# Patient Record
Sex: Female | Born: 1935 | Race: Black or African American | Hispanic: No | Smoking: Never smoker
Health system: Southern US, Community
[De-identification: ages and names within clinical notes are randomized; demographics above are authoritative.]

## PROBLEM LIST (undated history)

## (undated) DIAGNOSIS — G5600 Carpal tunnel syndrome, unspecified upper limb: Secondary | ICD-10-CM

## (undated) DIAGNOSIS — M797 Fibromyalgia: Secondary | ICD-10-CM

## (undated) DIAGNOSIS — F329 Major depressive disorder, single episode, unspecified: Secondary | ICD-10-CM

## (undated) DIAGNOSIS — I10 Essential (primary) hypertension: Secondary | ICD-10-CM

## (undated) DIAGNOSIS — R32 Unspecified urinary incontinence: Secondary | ICD-10-CM

## (undated) DIAGNOSIS — F0390 Unspecified dementia without behavioral disturbance: Secondary | ICD-10-CM

## (undated) DIAGNOSIS — F32A Depression, unspecified: Secondary | ICD-10-CM

## (undated) DIAGNOSIS — E039 Hypothyroidism, unspecified: Secondary | ICD-10-CM

## (undated) DIAGNOSIS — M4800 Spinal stenosis, site unspecified: Secondary | ICD-10-CM

## (undated) HISTORY — DX: Hypothyroidism, unspecified: E03.9

## (undated) HISTORY — DX: Major depressive disorder, single episode, unspecified: F32.9

## (undated) HISTORY — PX: SPINE SURGERY: SHX786

## (undated) HISTORY — DX: Essential (primary) hypertension: I10

## (undated) HISTORY — DX: Unspecified urinary incontinence: R32

## (undated) HISTORY — DX: Depression, unspecified: F32.A

---

## 1963-03-16 HISTORY — PX: BREAST LUMPECTOMY: SHX2

## 1997-07-18 ENCOUNTER — Ambulatory Visit: Admission: RE | Admit: 1997-07-18 | Discharge: 1997-07-18 | Payer: Self-pay | Admitting: Family Medicine

## 1998-10-30 ENCOUNTER — Observation Stay (HOSPITAL_COMMUNITY): Admission: AD | Admit: 1998-10-30 | Discharge: 1998-11-01 | Payer: Self-pay | Admitting: Family Medicine

## 1998-10-30 ENCOUNTER — Encounter: Payer: Self-pay | Admitting: Family Medicine

## 1998-12-04 ENCOUNTER — Encounter: Payer: Self-pay | Admitting: Family Medicine

## 1998-12-04 ENCOUNTER — Ambulatory Visit (HOSPITAL_COMMUNITY): Admission: RE | Admit: 1998-12-04 | Discharge: 1998-12-04 | Payer: Self-pay | Admitting: Family Medicine

## 1999-01-14 ENCOUNTER — Encounter: Payer: Self-pay | Admitting: Cardiology

## 1999-01-14 ENCOUNTER — Ambulatory Visit (HOSPITAL_COMMUNITY): Admission: RE | Admit: 1999-01-14 | Discharge: 1999-01-14 | Payer: Self-pay | Admitting: Cardiology

## 1999-06-24 ENCOUNTER — Encounter: Payer: Self-pay | Admitting: Family Medicine

## 1999-06-24 ENCOUNTER — Ambulatory Visit (HOSPITAL_COMMUNITY): Admission: RE | Admit: 1999-06-24 | Discharge: 1999-06-24 | Payer: Self-pay | Admitting: Family Medicine

## 1999-07-07 ENCOUNTER — Encounter: Payer: Self-pay | Admitting: Neurosurgery

## 1999-07-09 ENCOUNTER — Inpatient Hospital Stay (HOSPITAL_COMMUNITY): Admission: RE | Admit: 1999-07-09 | Discharge: 1999-07-14 | Payer: Self-pay | Admitting: Neurosurgery

## 1999-07-14 ENCOUNTER — Inpatient Hospital Stay (HOSPITAL_COMMUNITY)
Admission: RE | Admit: 1999-07-14 | Discharge: 1999-07-23 | Payer: Self-pay | Admitting: Physical Medicine and Rehabilitation

## 1999-10-22 ENCOUNTER — Encounter: Admission: RE | Admit: 1999-10-22 | Discharge: 2000-01-20 | Payer: Self-pay | Admitting: Family Medicine

## 2000-04-04 ENCOUNTER — Encounter: Admission: RE | Admit: 2000-04-04 | Discharge: 2000-07-03 | Payer: Self-pay | Admitting: Family Medicine

## 2000-04-19 ENCOUNTER — Encounter: Admission: RE | Admit: 2000-04-19 | Discharge: 2000-04-19 | Payer: Self-pay | Admitting: Otolaryngology

## 2000-04-19 ENCOUNTER — Encounter: Payer: Self-pay | Admitting: Otolaryngology

## 2000-10-04 ENCOUNTER — Ambulatory Visit (HOSPITAL_COMMUNITY): Admission: RE | Admit: 2000-10-04 | Discharge: 2000-10-04 | Payer: Self-pay | Admitting: Obstetrics

## 2000-10-04 ENCOUNTER — Encounter: Payer: Self-pay | Admitting: Obstetrics

## 2000-10-05 ENCOUNTER — Ambulatory Visit (HOSPITAL_COMMUNITY): Admission: RE | Admit: 2000-10-05 | Discharge: 2000-10-05 | Payer: Self-pay | Admitting: Obstetrics

## 2000-10-05 ENCOUNTER — Encounter (INDEPENDENT_AMBULATORY_CARE_PROVIDER_SITE_OTHER): Payer: Self-pay

## 2003-07-08 ENCOUNTER — Encounter: Admission: RE | Admit: 2003-07-08 | Discharge: 2003-07-08 | Payer: Self-pay | Admitting: Family Medicine

## 2003-10-31 ENCOUNTER — Ambulatory Visit (HOSPITAL_COMMUNITY): Admission: RE | Admit: 2003-10-31 | Discharge: 2003-10-31 | Payer: Self-pay | Admitting: Obstetrics and Gynecology

## 2004-07-09 ENCOUNTER — Encounter: Admission: RE | Admit: 2004-07-09 | Discharge: 2004-07-09 | Payer: Self-pay | Admitting: Orthopedic Surgery

## 2004-09-24 ENCOUNTER — Ambulatory Visit (HOSPITAL_COMMUNITY): Admission: RE | Admit: 2004-09-24 | Discharge: 2004-09-24 | Payer: Self-pay | Admitting: Family Medicine

## 2005-07-01 ENCOUNTER — Ambulatory Visit: Payer: Self-pay | Admitting: Cardiology

## 2005-07-21 ENCOUNTER — Ambulatory Visit: Payer: Self-pay

## 2005-07-21 ENCOUNTER — Encounter: Payer: Self-pay | Admitting: Internal Medicine

## 2005-07-21 ENCOUNTER — Ambulatory Visit: Payer: Self-pay | Admitting: Cardiology

## 2005-07-23 ENCOUNTER — Ambulatory Visit: Payer: Self-pay | Admitting: Cardiology

## 2005-10-19 ENCOUNTER — Ambulatory Visit (HOSPITAL_COMMUNITY): Admission: RE | Admit: 2005-10-19 | Discharge: 2005-10-19 | Payer: Self-pay | Admitting: Family Medicine

## 2009-12-02 ENCOUNTER — Ambulatory Visit: Payer: Self-pay | Admitting: Cardiovascular Disease

## 2009-12-02 DIAGNOSIS — I739 Peripheral vascular disease, unspecified: Secondary | ICD-10-CM | POA: Insufficient documentation

## 2009-12-02 DIAGNOSIS — R079 Chest pain, unspecified: Secondary | ICD-10-CM

## 2009-12-23 ENCOUNTER — Telehealth (INDEPENDENT_AMBULATORY_CARE_PROVIDER_SITE_OTHER): Payer: Self-pay | Admitting: Radiology

## 2009-12-24 ENCOUNTER — Ambulatory Visit: Payer: Self-pay

## 2009-12-24 ENCOUNTER — Ambulatory Visit: Payer: Self-pay | Admitting: Cardiology

## 2009-12-24 ENCOUNTER — Encounter (HOSPITAL_COMMUNITY)
Admission: RE | Admit: 2009-12-24 | Discharge: 2010-01-09 | Payer: Self-pay | Source: Home / Self Care | Admitting: Cardiovascular Disease

## 2009-12-24 ENCOUNTER — Ambulatory Visit (HOSPITAL_COMMUNITY): Admission: RE | Admit: 2009-12-24 | Discharge: 2009-12-24 | Payer: Self-pay | Admitting: Cardiovascular Disease

## 2009-12-24 ENCOUNTER — Ambulatory Visit: Payer: Self-pay | Admitting: Cardiovascular Disease

## 2009-12-24 ENCOUNTER — Encounter: Payer: Self-pay | Admitting: Cardiovascular Disease

## 2009-12-31 ENCOUNTER — Ambulatory Visit: Payer: Self-pay | Admitting: Cardiovascular Disease

## 2010-04-14 NOTE — Assessment & Plan Note (Signed)
Summary: Cardiology Nuclear Testing  Nuclear Med Background Indications for Stress Test: Evaluation for Ischemia, PTCA Patency   History: Angioplasty, Echo, Heart Catheterization, Myocardial Perfusion Study  History Comments: '07 VOZ:DGUYQI thinning, EF=72%  Symptoms: Chest Pressure, Chest Pressure with Exertion, Dizziness, DOE, Nausea, Palpitations  Symptoms Comments: Last episode of HK:VQQV week   Nuclear Pre-Procedure Cardiac Risk Factors: Claudication, Hypertension, Lipids, Obesity, TIA Caffeine/Decaff Intake: None NPO After: 12:00 AM Lungs: Clear.  O2 SAT 97% on RA. IV 0.9% NS with Angio Cath: 22g     IV Site: R Antecubital IV Started by: Bonnita Levan, RN Chest Size (in) 40     Cup Size B     Height (in): 64 Weight (lb): 201 BMI: 34.63  Nuclear Med Study 1 or 2 day study:  1 day     Stress Test Type:  Eugenie Birks Reading MD:  Charlton Haws, MD     Referring MD:  Verne Carrow, MD Resting Radionuclide:  Technetium 29m Tetrofosmin     Resting Radionuclide Dose:  11.0 mCi  Stress Radionuclide:  Technetium 52m Tetrofosmin     Stress Radionuclide Dose:  33.0 mCi   Stress Protocol   Lexiscan: 0.4 mg   Stress Test Technologist:  [employer]a [employer], CMA-N     Nuclear Technologist:  Doyne Keel, CNMT  Rest Procedure  Myocardial perfusion imaging was performed at rest 45 minutes following the intravenous administration of Technetium 49m Tetrofosmin.  Stress Procedure  The patient received IV Lexiscan 0.4 mg over 15-seconds.  Technetium 19m Tetrofosmin injected at 30-seconds.  There were no significant changes with infusion.  She did c/o chest pressure with infusion.  Quantitative spect images were obtained after a 45 minute delay.                                                                                                                                                                                                                                                               Ms. Kerry Garcia was still c/o chest pressure, 3/10, after stress images.  Dr. Daleen Squibb was shown the images  and he advised giving the patient NTG to relieve the chest pressure.  BP prior to NTG #1 was 142/65 and after NTG #3 was 113/68 and patient was pain free.  Dr. Daleen Squibb said patient  could leave and would be called with the report.  QPS Raw Data Images:  Normal; no motion artifact; normal heart/lung ratio. Stress Images:  Normal homogeneous uptake in all areas of the myocardium. Rest Images:  Normal homogeneous uptake in all areas of the myocardium. Subtraction (SDS):  SDS 8 with ischemia in anteroapical wall Transient Ischemic Dilatation:  1.15  (Normal <1.22)  Lung/Heart Ratio:  0.27  (Normal <0.45)  Quantitative Gated Spect Images QGS EDV:  81 ml QGS ESV:  29 ml QGS EF:  64 % QGS cine images:  normal  Findings Low risk nuclear study      Overall Impression  Exercise Capacity: Lexiscan with no exercise. BP Response: Normal blood pressure response. Clinical Symptoms: Chest Pain ECG Impression: No significant ST segment change suggestive of ischemia. Overall Impression: Mild anterior wall ischemia.  No infarct.  SDS 8 abnormal in anteroapex.    Appended Document: Cardiology Nuclear Testing [facility], She has a low risk stress test. It is not completely normal. Is she coming back in soon to see me? If not, can we move her appt up so I can discuss with her? thanks, cdm

## 2010-04-14 NOTE — Assessment & Plan Note (Signed)
Summary: per check out/sf   Visit Type:  1 mo f/u Primary Provider:  Dr. Nehemiah Settle  CC:  chest pain.  History of Present Illness: 77 yo AAF with history of HTN, hyperlipidemia here today for cardiac follow up. She was seen as a new patient several weeks ago as a self referral for evaluation of chest pain. I take care of her daughter and she wanted to establish care in our office. She described pressure and heaviness in her chest both at night and during the day. It seems to be worst at night when she is lying down. This occurs under her left breast, no radiation of pain. There was no associated SOB, nausea or diaphoresis. Occasional exertional chest pain. Echo and stress test were ordered. (Outlined below). Stress images with possible mild anterior wall ischemia, low risk study. LV function normal. She also complained of pain in lower extremities with ambulation. Arterial dopplers were normal.    Current Medications (verified): 1)  Diovan Hct 160-12.5 Mg Tabs (Valsartan-Hydrochlorothiazide) .Marland Kitchen.. 1 Tab Once Daily 2)  Aspirin 81 Mg Tbec (Aspirin) .... Take One Tablet By Mouth Daily 3)  One-A-Day Womens 50 Plus  Tabs (Multiple Vitamins-Minerals) .... Take 1 Tablet By Mouth Once A Day  Allergies (verified): No Known Drug Allergies  Past History:  Past Medical History: Reviewed history from 12/02/2009 and no changes required. Chiari malformation Hypertension Idiopathic peripheral neuropathy Osteoarthritis  Cardiac cath in [place] in early 1990s and told it was ok Borderline Hyperlipidemia UTI Memory Loss  Social History: Reviewed history from 12/02/2009 and no changes required. No tobacco No alcohol No drugs Widow, 5 children Retired Engineer, civil (consulting)  Review of Systems       The patient complains of chest pain.  The patient denies fatigue, malaise, fever, weight gain/loss, vision loss, decreased hearing, hoarseness, palpitations, shortness of breath, prolonged cough, wheezing, sleep apnea,  coughing up blood, abdominal pain, blood in stool, nausea, vomiting, diarrhea, heartburn, incontinence, blood in urine, muscle weakness, joint pain, leg swelling, rash, skin lesions, headache, fainting, dizziness, depression, anxiety, enlarged lymph nodes, easy bruising or bleeding, and environmental allergies.    Vital Signs:  Patient profile:   77 year old female Height:      64 inches Weight:      204.4 pounds BMI:     35.21 Pulse rate:   72 / minute Pulse rhythm:   regular BP sitting:   130 / 60  (left arm) Cuff size:   large  Vitals Entered By: Danielle Rankin, CMA (December 31, 2009 12:24 PM)  Physical Exam  General:  General: Well developed, well nourished, NAD Musculoskeletal: Muscle strength 5/5 all ext Psychiatric: Mood and affect normal Neck: No JVD, no carotid bruits, no thyromegaly, no lymphadenopathy. Lungs:Clear bilaterally, no wheezes, rhonci, crackles CV: RRR no murmurs, gallops rubs Abdomen: soft, NT, ND, BS present Extremities: No edema, pulses 2+.    Echocardiogram  Procedure date:  12/24/2009  Findings:      Left ventricle: The cavity size was normal. Wall thickness was       normal. Systolic function was normal. The estimated ejection       fraction was in the range of 60% to 65%. Wall motion was normal;       there were no regional wall motion abnormalities. Doppler       parameters are consistent with abnormal left ventricular       relaxation (grade 1 diastolic dysfunction).     - Mitral valve: Mild regurgitation.     -  Left atrium: The atrium was mildly to moderately dilated.     - Atrial septum: There was an atrial septal aneurysm.  Nuclear Study  Procedure date:  12/24/2009  Findings:      The patient received IV Lexiscan 0.4 mg over 15-seconds.  Technetium 62m Tetrofosmin injected at 30-seconds.  There were no significant changes with infusion.  She did c/o chest pressure with infusion.  Quantitative spect images were obtained after a 45 minute  delay.                                                                                                                                                                                                                                                              Ms. Martina Sinner was still c/o chest pressure, 3/10, after stress images.  Dr. Daleen Squibb was shown the images  and he advised giving the patient NTG to relieve the chest pressure.  BP prior to NTG #1 was 142/65 and after NTG #3 was 113/68 and patient was pain free.  Dr. Daleen Squibb said patient could leave and would be called with the report.  QPS  Raw Data Images:  Normal; no motion artifact; normal heart/lung ratio. Stress Images:  Normal homogeneous uptake in all areas of the myocardium. Rest Images:  Normal homogeneous uptake in all areas of the myocardium. Subtraction (SDS):  SDS 8 with ischemia in anteroapical wall Transient Ischemic Dilatation:  1.15  (Normal <1.22)  Lung/Heart Ratio:  0.27  (Normal <0.45)  Quantitative Gated Spect Images  QGS EDV:  81 ml QGS ESV:  29 ml QGS EF:  64 % QGS cine images:  normal  Findings  Low risk nuclear study  Overall Impression   Exercise Capacity: Lexiscan with no exercise. BP Response: Normal blood pressure response. Clinical Symptoms: Chest Pain ECG Impression: No significant ST segment change suggestive of ischemia. Overall Impression: Mild anterior wall ischemia.  No infarct.  SDS 8 abnormal in anteroapex.     Arterial Doppler  Procedure date:  12/24/2009  Findings:      Normal ABI bilaterally with triphasic flow in both legs from CFA to feet.   Impression & Recommendations:  Problem # 1:  CHEST PAIN (ICD-786.50) She continues to have occasional episodes of resting chest pain, mainly atypical features. Her stress  myoview is low risk with possible area of mild ischemia in the anterior wall. LV wall motion is normal. I have discussed a cardiac cath to define the presence of CAD. She does not  wish to proceed with the cath at this time. If she continues to have chest pain, she will consider calling us back to discuss the cath. Will give her NTG SL 0.4 mg to use as needed. She will call EMS if she has severe pain that does not resolve with NTG.    Her updated medication list for this problem includes:    Aspirin 81 Mg Tbec (Aspirin) .Marland Kitchen... Take one tablet by mouth daily  Patient Instructions: 1)  Your physician recommends that you schedule a follow-up appointment in: 2 months 2)  Your physician recommends that you continue on your current medications as directed. Please refer to the Current Medication list given to you today. Prescriptions: NITROSTAT 0.4 MG SUBL (NITROGLYCERIN) 1 tablet under tongue at onset of chest pain; you may repeat every 5 minutes for up to 3 doses.  #25 x 1   Entered by:   Whitney Maeola Sarah RN   Authorized by:   Verne Carrow, MD   Signed by:   Ellender Hose RN on 12/31/2009   Method used:   Electronically to        [id number] (retail)       [address]       [place], [place]  [address]       Ph: [phone] or [phone]       Fax: [phone]   RxID:   [id number]

## 2010-04-14 NOTE — Progress Notes (Signed)
Summary: NUC PRE-PROCEDURE  Phone Note Outgoing Call   Call placed by: Domenic Polite, CNMT,  December 23, 2009 12:40 PM Call placed to: Patient Summary of Call: Left message with information on Myoview Information Sheet (see scanned document for details).  Initial call taken by: Domenic Polite, CNMT,  December 23, 2009 12:40 PM     Nuclear Med Background Indications for Stress Test: Evaluation for Ischemia, PTCA Patency   History: Angioplasty, Echo, Heart Catheterization, Myocardial Perfusion Study  History Comments: '90's CATH/ PTCA NO REPORT, '07 NML ECHO, MPI APICAL THIN. EF=72%  Symptoms: Chest Pain, Chest Pain with Exertion, Chest Pressure    Nuclear Pre-Procedure Cardiac Risk Factors: Claudication, Hypertension, Lipids Height (in): 64

## 2010-04-14 NOTE — Assessment & Plan Note (Signed)
Summary: per check out/sf   Visit Type:  Patient last seen 5-07 Dr. Diona Browner Primary Provider:  Dr. Nehemiah Settle  CC:  Chest discomfort.  History of Present Illness: 77 yo AAF with history of HTN, hyperlipidemia presents today as a self referral for evaluation of chest pain. I take care of her daughter and she wanted to establish care in our office. She describes pressure and heaviness in her chest both at night and during the day. It seems to be worst at night when she is lying down. This occurs under her left breast, no radiation of pain. There is no associated SOB, nausea or diaphoresis. Occasional exertional chest pain. She has had previous stress testing and reports of mild CAD by cath from [place] but these records are not available today.   Extensive review of old records.  Stress myoview with Dr. Diona Browner 07/21/05 with apical thinning, no ischemia, EF of 72%. Echo at that time with EF of 60%, no WMA, mild MR.   Current Medications (verified): 1)  Diovan 160 Mg Tabs (Valsartan) .... Take One Tablet By Mouth Daily 2)  Aspirin Ec 325 Mg Tbec (Aspirin) .... Take One Tablet By Mouth Daily 3)  One-A-Day Womens 50 Plus  Tabs (Multiple Vitamins-Minerals) .... Take 1 Tablet By Mouth Once A Day  Allergies (verified): No Known Drug Allergies  Past History:  Past Medical History: Chiari malformation Hypertension Idiopathic peripheral neuropathy Osteoarthritis  Cardiac cath in [place] in early 1990s and told it was ok Borderline Hyperlipidemia UTI Memory Loss  Past Surgical History: Neurosurgery for correction of Chiari Malformation-2001 Breast biopsy-benign  Family History: Mother-deceased-old age, HTN Father-deceased, ? 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   The patient denies fatigue, malaise, fever, weight gain/loss, vision loss, decreased hearing, hoarseness, palpitations, shortness of breath, prolonged cough, wheezing, sleep apnea, coughing up blood, abdominal pain, blood in stool, nausea, vomiting, diarrhea, heartburn, incontinence, blood in urine, muscle weakness, joint pain, leg swelling, rash, skin lesions, headache, fainting, dizziness, depression, anxiety, enlarged lymph nodes, easy bruising or bleeding, and environmental allergies.    Vital Signs:  Patient profile:   77 year old female Height:      64 inches Weight:      202.50 pounds BMI:     34.88 Pulse rate:   72 / minute Pulse rhythm:   regular Resp:     18 per minute BP sitting:   134 / 70  (left arm) Cuff size:   large  Vitals Entered By: Vikki Ports (December 02, 2009 3:47 PM)  Physical Exam  General:  General: Well developed, well nourished, NAD HEENT: OP clear, mucus membranes moist SKIN: warm, dry Neuro: No focal deficits Musculoskeletal: Muscle strength 5/5 all ext Psychiatric: Mood and affect normal Neck: No JVD, no carotid bruits, no thyromegaly, no lymphadenopathy. Lungs:Clear bilaterally, no wheezes, rhonci, crackles CV: RRR no murmurs, gallops rubs Abdomen: soft, NT, ND, BS present Extremities: No edema, pulses 2+.    EKG  Procedure date:  12/02/2009  Findings:      NSR rate 72 bpm.   Impression & Recommendations:  Problem # 1:  CHEST PAIN (ICD-786.50) Some typical and some atypical features. Will arrange Lexiscan myoview to exclude ischemia. We will also arrange echo to  assess LV size and function, exclude pericardial effusion and evaluate her valves.   Her updated medication list for this problem includes:    Aspirin Ec 325 Mg Tbec (Aspirin) .Marland Kitchen... Take one tablet by mouth daily  Orders: EKG w/ Interpretation (93000) Echocardiogram (Echo) Nuclear Stress Test (Nuc Stress Test)  Problem # 2:  CLAUDICATION (ICD-443.9) Pain with ambulation.  Check ABI.   Other Orders: Arterial Duplex Lower Extremity (Arterial Duplex Low)  Patient Instructions: 1)  Your physician recommends that you schedule a follow-up appointment in: 4 WEEKS 2)  Your physician recommends that you continue on your current medications as directed. Please refer to the Current Medication list given to you today. 3)  Your physician has requested that you have an ankle brachial index (ABI). During this test an ultrasound and blood pressure cuff are used to evaluate the arteries that supply the arms and legs with blood. Allow thirty minutes for this exam. There are no restrictions or special instructions. 4)  Your physician has requested that you have an echocardiogram.  Echocardiography is a painless test that uses sound waves to create images of your heart. It provides your doctor with information about the size and shape of your heart and how well your heart's chambers and valves are working.  This procedure takes approximately one hour. There are no restrictions for this procedure. 5)  Your physician has requested that you have an lexiscan myoview.  For further information please visit [email or web address]  Please follow instruction sheet, as given.

## 2010-05-24 ENCOUNTER — Observation Stay (HOSPITAL_COMMUNITY)
Admission: EM | Admit: 2010-05-24 | Discharge: 2010-05-27 | Disposition: A | Discharge status: Home / Self Care | Payer: Medicare Other | Attending: Internal Medicine | Admitting: Internal Medicine

## 2010-05-24 ENCOUNTER — Observation Stay (HOSPITAL_COMMUNITY): Payer: Medicare Other

## 2010-05-24 ENCOUNTER — Emergency Department (HOSPITAL_COMMUNITY): Payer: Medicare Other

## 2010-05-24 DIAGNOSIS — R82998 Other abnormal findings in urine: Secondary | ICD-10-CM | POA: Insufficient documentation

## 2010-05-24 DIAGNOSIS — I6789 Other cerebrovascular disease: Secondary | ICD-10-CM | POA: Insufficient documentation

## 2010-05-24 DIAGNOSIS — M129 Arthropathy, unspecified: Secondary | ICD-10-CM | POA: Insufficient documentation

## 2010-05-24 DIAGNOSIS — F329 Major depressive disorder, single episode, unspecified: Secondary | ICD-10-CM | POA: Insufficient documentation

## 2010-05-24 DIAGNOSIS — I2510 Atherosclerotic heart disease of native coronary artery without angina pectoris: Secondary | ICD-10-CM | POA: Insufficient documentation

## 2010-05-24 DIAGNOSIS — I10 Essential (primary) hypertension: Secondary | ICD-10-CM | POA: Insufficient documentation

## 2010-05-24 DIAGNOSIS — H409 Unspecified glaucoma: Secondary | ICD-10-CM | POA: Insufficient documentation

## 2010-05-24 DIAGNOSIS — IMO0001 Reserved for inherently not codable concepts without codable children: Secondary | ICD-10-CM | POA: Insufficient documentation

## 2010-05-24 DIAGNOSIS — R918 Other nonspecific abnormal finding of lung field: Secondary | ICD-10-CM | POA: Insufficient documentation

## 2010-05-24 DIAGNOSIS — D649 Anemia, unspecified: Secondary | ICD-10-CM | POA: Insufficient documentation

## 2010-05-24 DIAGNOSIS — R29898 Other symptoms and signs involving the musculoskeletal system: Principal | ICD-10-CM | POA: Insufficient documentation

## 2010-05-24 DIAGNOSIS — Z9181 History of falling: Secondary | ICD-10-CM | POA: Insufficient documentation

## 2010-05-24 DIAGNOSIS — F3289 Other specified depressive episodes: Secondary | ICD-10-CM | POA: Insufficient documentation

## 2010-05-24 DIAGNOSIS — R5381 Other malaise: Secondary | ICD-10-CM | POA: Insufficient documentation

## 2010-05-24 DIAGNOSIS — M1710 Unilateral primary osteoarthritis, unspecified knee: Secondary | ICD-10-CM | POA: Insufficient documentation

## 2010-05-24 LAB — URINE MICROSCOPIC-ADD ON

## 2010-05-24 LAB — BASIC METABOLIC PANEL
BUN: 21 mg/dL (ref 6–23)
CO2: 31 mEq/L (ref 19–32)
Chloride: 101 mEq/L (ref 96–112)
Creatinine, Ser: 0.8 mg/dL (ref 0.4–1.2)
Potassium: 3.9 mEq/L (ref 3.5–5.1)

## 2010-05-24 LAB — CBC
Hemoglobin: 10.7 g/dL — ABNORMAL LOW (ref 12.0–15.0)
MCH: 25.9 pg — ABNORMAL LOW (ref 26.0–34.0)
MCHC: 32.1 g/dL (ref 30.0–36.0)
Platelets: 196 10*3/uL (ref 150–400)
RBC: 4.13 MIL/uL (ref 3.87–5.11)

## 2010-05-24 LAB — URINALYSIS, ROUTINE W REFLEX MICROSCOPIC
Glucose, UA: NEGATIVE mg/dL
Ketones, ur: NEGATIVE mg/dL
Nitrite: NEGATIVE
Protein, ur: NEGATIVE mg/dL
pH: 6 (ref 5.0–8.0)

## 2010-05-24 LAB — IRON AND TIBC
Iron: 53 ug/dL (ref 42–135)
Saturation Ratios: 17 % — ABNORMAL LOW (ref 20–55)
UIBC: 258 ug/dL

## 2010-05-24 LAB — DIFFERENTIAL
Basophils Absolute: 0 10*3/uL (ref 0.0–0.1)
Basophils Relative: 0 % (ref 0–1)
Eosinophils Absolute: 0.7 10*3/uL (ref 0.0–0.7)
Eosinophils Relative: 8 % — ABNORMAL HIGH (ref 0–5)
Monocytes Absolute: 0.7 10*3/uL (ref 0.1–1.0)
Monocytes Relative: 8 % (ref 3–12)
Neutro Abs: 4.7 10*3/uL (ref 1.7–7.7)

## 2010-05-24 LAB — T4, FREE: Free T4: 1.18 ng/dL (ref 0.80–1.80)

## 2010-05-24 NOTE — H&P (Signed)
NAME:  Kerry Garcia, Kerry Garcia NO.:  [id number]  MEDICAL RECORD NO.:  [id number]           PATIENT TYPE:  E  LOCATION:  [facility]                         FACILITY:  [facility]  PHYSICIAN:  Andreas Blower, MD       DATE OF BIRTH:  05-Nov-1925  DATE OF ADMISSION:  05/24/2010 DATE OF DISCHARGE:                             HISTORY & PHYSICAL   PRIMARY CARE PHYSICIAN:  Deirdre Peer. Polite, M.D.  CHIEF COMPLAINT:  Fall.  HISTORY OF PRESENT ILLNESS:  Kerry Garcia is an 77 year old African American female with history of hypertension and depression, coronary artery disease, UTI, fibromyalgia, neuropathy, glaucoma, arthritis who presents with the above complaints.  She reports that in the last 3 months, she has not been feeling her usual self, after her daughter's death.  She reports that over the last 3 months, she has been feeling more tired and had difficulty focusing.  This morning, she woke up and was getting ready for church and at 6:45 a.m. she was trying to get something from the house and she was in the hall and suddenly fell forward and landed on her abdomen.  She denies hitting her head or losing consciousness.  She denies any preceding symptoms such as dizziness, lightheadedness.  She did not have any pain after the fall. She could not get up after she had the fall an the result called her daughter to come pick her up.  EMS was called and she was transferred to the ER via ambulance.  In the ER, she was found to have a urinary tract infection and when the ER personnel attempted to have the patient ambulate, it took three people to get her steady in her feet.  She denies any recent fevers or chills.  Denies any nausea or vomiting. Denies any chest pain, shortness of breath.  Denies any abdominal pain currently, does report intermittent abdominal pain, has been feeling constipated at times.  Denies any headaches or vision changes.  REVIEW OF SYSTEMS:  All systems were reviewed  with the patient and positive as per HPI, otherwise all other systems are negative.  PAST MEDICAL HISTORY: 1. Hypertension. 2. History of coronary disease. 3. Depression. 4. History of neuropathy. 5. Arthritis. 6. Fibromyalgia. 7. History of UTIs. 8. 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  Has completed     antibiotic course for that. 9. Arthritis. 10.Glaucoma.  SOCIAL HISTORY:  The patient lives by herself, does not smoke, does not drink any alcohol.  FAMILY HISTORY:  Significant for father having stroke, mother having sarcoidosis.  Had two other sisters and 5 brothers who are all deceased.  HOME MEDICATIONS:  Could not be reconciled with this patient, because the patient cannot recall what medications she takes.  PHYSICAL EXAMINATION:  VITAL SIGNS:  Temperature 98.5, blood pressure is 147/57, heart rate 63, respirations 20, satting 98% on room air. GENERAL:  The patient was alert, oriented, did not appear to be in any acute distress, was lying in bed comfortably. HEENT:  Extraocular motions are intact.  Pupils are equal and round. Has moist mucous membranes. NECK:  Supple. HEART:  Regular with S1 and  S2. LUNGS:  Clear to auscultation bilaterally. ABDOMEN:  Soft, nontender, nondistended.  Positive bowel sounds. EXTREMITIES:  The patient has good peripheral pulses with trace edema. NEURO:  Cranial nerves II through XII grossly intact, has 5/5 motor strength in upper as well as the extremities. EXTREMITIES:  The patient had good peripheral pulses with trace edema.  RADIOLOGY/IMAGING:  The patient had chest x-ray, two view which showed subcentimeter left pulmonary nodules.  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  The patient had a head CT without contrast on May 24, 2010 which showed no evidence of acute intracranial abnormality.  Mild chronic small vessel white matter ischemic changes and suboccipital craniotomy.  LABORATORY DATA:   CBC shows a white count of 8.4, hemoglobin 10.7, hematocrit 33.3, platelet count 196.  Electrolytes normal with a creatinine of 0.80.  UA was negative for nitrates, had large leukocytes, 21-50 wbc's, many bacteria.  HOSPITAL COURSE: 1. Fall, multifactorial, suspect it may be due to UTI with generalized     weakness and deconditioning. 2. Urinary tract infection.  We will have her on ceftriaxone, further     tailor rate of antibiotics based on urine culture and antibiotics     sensitivity if any organism grows.  The patient reports that she     has an intolerance to an oral antibiotic which she has received in     the past for UTI, uncertain what that antibiotics was. 3. Generalized weakness.  The patient is neurologically intact.     Head CT, was negative. 4. Pulmonary nodule, suspect may be due to her history of TB; however,     we will get the CT of the chest without contrast for further     evaluation. 5. Right thyroid mass.  We will send for a TSH, free T4 and T3.     We will get a thyroid ultrasound for further evaluation.  Based on     that ultrasound, the patient may need FNA and biopsy, but will     defer to Dr. Nehemiah Settle.6. Generalized weakness.  PT evaluation has been ordered. 7. Hypertension, currently stable at this time.  Resume home     antihypertensives once known. 8. History of coronary disease, stable, not an active issue at this     time. 9. Depression.  Resume home medication once the home medication is     known.  The patient believes that it may be citalopram.  She does     report that she gets heartburn pain whenever she takes the even     dose of citalopram.  We will defer to Dr. Nehemiah Settle to address this. 10.Anemia.  We will send for an anemia panel.  Hemoglobin is stable at     this time. 11.Prophylaxis.  Lovenox for DVT prophylaxis. 12.Code status is full code.   Time spent on admission talking with the patient, talking to the patient's family and coordinating  care was 1 hour.   Andreas Blower, MD   SR/MEDQ  D:  05/24/2010  T:  05/24/2010  Job:  [id number]  cc:   Deirdre Peer. Polite, M.D.  Electronically Signed by Wardell Heath Emeka Lindner  on 05/24/2010 09:51:11 PM

## 2010-05-25 ENCOUNTER — Observation Stay (HOSPITAL_COMMUNITY): Payer: Medicare Other

## 2010-05-25 ENCOUNTER — Observation Stay (HOSPITAL_COMMUNITY): Payer: BC Managed Care – PPO

## 2010-05-25 LAB — URINE CULTURE

## 2010-05-25 LAB — BASIC METABOLIC PANEL
CO2: 28 mEq/L (ref 19–32)
Calcium: 8.5 mg/dL (ref 8.4–10.5)
Creatinine, Ser: 0.67 mg/dL (ref 0.4–1.2)
Glucose, Bld: 97 mg/dL (ref 70–99)
Potassium: 4.1 mEq/L (ref 3.5–5.1)
Sodium: 141 mEq/L (ref 135–145)

## 2010-05-25 LAB — CBC
HCT: 31.5 % — ABNORMAL LOW (ref 36.0–46.0)
Hemoglobin: 10.1 g/dL — ABNORMAL LOW (ref 12.0–15.0)
MCH: 25.4 pg — ABNORMAL LOW (ref 26.0–34.0)
MCV: 79.3 fL (ref 78.0–100.0)

## 2010-05-26 ENCOUNTER — Observation Stay (HOSPITAL_COMMUNITY): Payer: BC Managed Care – PPO

## 2010-05-26 ENCOUNTER — Observation Stay (HOSPITAL_COMMUNITY): Payer: Medicare Other

## 2010-05-26 MED ORDER — SODIUM PERTECHNETATE TC 99M INJECTION
10.0000 | Freq: Once | INTRAVENOUS | Status: AC | PRN
  Administered 2010-05-26: 10 via INTRAVENOUS

## 2010-05-26 MED ORDER — SODIUM IODIDE I 131 CAPSULE
20.0000 | Freq: Once | INTRAVENOUS | Status: AC | PRN
  Administered 2010-05-26: 20 via ORAL

## 2010-06-08 ENCOUNTER — Ambulatory Visit: Payer: BC Managed Care – PPO | Attending: Internal Medicine | Admitting: Physical Therapy

## 2010-06-08 DIAGNOSIS — M25569 Pain in unspecified knee: Secondary | ICD-10-CM | POA: Insufficient documentation

## 2010-06-08 DIAGNOSIS — R262 Difficulty in walking, not elsewhere classified: Secondary | ICD-10-CM | POA: Insufficient documentation

## 2010-06-08 DIAGNOSIS — M6281 Muscle weakness (generalized): Secondary | ICD-10-CM | POA: Insufficient documentation

## 2010-06-08 DIAGNOSIS — IMO0001 Reserved for inherently not codable concepts without codable children: Secondary | ICD-10-CM | POA: Insufficient documentation

## 2010-06-10 ENCOUNTER — Ambulatory Visit: Payer: Medicare Other | Attending: Internal Medicine

## 2010-06-10 DIAGNOSIS — R262 Difficulty in walking, not elsewhere classified: Secondary | ICD-10-CM | POA: Insufficient documentation

## 2010-06-10 DIAGNOSIS — IMO0001 Reserved for inherently not codable concepts without codable children: Secondary | ICD-10-CM | POA: Insufficient documentation

## 2010-06-10 DIAGNOSIS — M25569 Pain in unspecified knee: Secondary | ICD-10-CM | POA: Insufficient documentation

## 2010-06-10 DIAGNOSIS — M6281 Muscle weakness (generalized): Secondary | ICD-10-CM | POA: Insufficient documentation

## 2010-06-22 ENCOUNTER — Ambulatory Visit: Payer: Medicare Other | Attending: Internal Medicine

## 2010-06-22 DIAGNOSIS — M6281 Muscle weakness (generalized): Secondary | ICD-10-CM | POA: Insufficient documentation

## 2010-06-22 DIAGNOSIS — IMO0001 Reserved for inherently not codable concepts without codable children: Secondary | ICD-10-CM | POA: Insufficient documentation

## 2010-06-22 DIAGNOSIS — R262 Difficulty in walking, not elsewhere classified: Secondary | ICD-10-CM | POA: Insufficient documentation

## 2010-06-22 DIAGNOSIS — M25569 Pain in unspecified knee: Secondary | ICD-10-CM | POA: Insufficient documentation

## 2010-06-22 NOTE — Discharge Summary (Signed)
NAMESHANEA, Kerry NO.:  [id number]  MEDICAL RECORD NO.:  [id number]           PATIENT TYPE:  I  LOCATION:  4507                         FACILITY:  [facility]  PHYSICIAN:  Kerry Garcia, M.D. DATE OF BIRTH:  July 07, 1925  DATE OF ADMISSION:  05/24/2010 DATE OF DISCHARGE:  05/27/2010                              DISCHARGE SUMMARY   DISCHARGE DIAGNOSES: 1. Fall, multifactorial in etiology secondary to generalized weakness,     deconditioning, and degenerative joint disease in the right knee     being discharged with outpatient services with PT for gait training     and strengthening. 2. Abnormal UA.  Final culture negative, the patient was empirically     treated for urinary tract infection.  Antibiotic course completed     while hospitalized. 3. Abnormal thyroid function test with a suppressed TSH.  Normal T4     and elevated T3.  Please note the patient had a thyroid mass noted     on imaging.  CT showed thyroid mass, also calcified granuloma in     the left upper lobe.  Thyroid ultrasound was consistent with     multinodular goiter.  Because of abnormal thyroid function studies,     nuclear medicine thyroid scan and uptake was obtained, which showed     uniform uptake within the left lobe of the thyroid gland, showed a     large cold nodule within the right lobe of thyroid gland, which     corresponds to nodule on ultrasound.  The patient without signs of     thyrotoxicosis.  Further evaluation will be obtained on an     outpatient basis. 4. Depression. 5. Hypertension relatively well controlled during this hospitalization     without her home meds.  She will continue with these.  We will     consider resumption on an outpatient basis. 6. Anemia.  DISCHARGE MEDICATIONS: 1. Aspirin 325 daily. 2. Cymbalta 30 mg daily. 3. Multivitamin daily. 4. Tylenol p.r.n.  The patient will hold Diovan/hydrochlorothiazide until further notice.  DISPOSITION:  The  patient is being discharged to home.  The patient will have outpatient rehab services for gait training and strengthening.  CONSULTANTS:  None.  STUDIES:  Nuclear medicine thyroid imaging uptake scan as discussed above showed a large cold nodule within the right lobe of the thyroid gland.  Thyroid ultrasound showed thyromegaly most compatible with benign multinodular goiter.  CT of the chest showed large mass in the right lobe of the thyroid gland, calcified granuloma in the left upper lobe, no worrisome mass, cholelithiasis.  UA, final culture negative. Basic metabolic panel unremarkable.  CBC; hemoglobin was 10, MCV 79, __________ folate 18, ferritin 25, T4 of 1.18, T3 of 453.  TSH 0.220.  HISTORY OF PRESENT ILLNESS:  Elderly female presented to ED after a fall.  In ED, the patient was evaluated and found to have abnormal UA. Admission was deemed necessary for further evaluation and treatment. Please see dictated H and P for further details.  PAST MEDICAL HISTORY:  Per admission H and P.  MEDICATIONS:  Per admission H and P.  SOCIAL  HISTORY:  Per admission H and P.  PAST SURGICAL HISTORY:  Per admission H and P.  ALLERGIES:  Per admission H and P.  FAMILY HISTORY:  Per admission H and P.  HOSPITAL COURSE:  The patient was admitted to a medicine floor bed for evaluation and treatment of fall.  It was felt to be multifactorial secondary to her deconditioning DJD in her right knee.  She did have imaging of her brain, which was negative for any acute intracranial abnormality.  Electrolytes and UA discussed above.  She was started empirically on treatment for presumed UTI; however, final culture was negative.  In the interim, she was seen by PT/OT.  Home Health Services were recommended on an outpatient basis.  The patient's thyroid studies were abnormal as stated above.  Results as discussed above.  She will have further outpatient management.  At this time, the patient is at  her baseline level of function.  She is medically stable for discharge with full further outpatient management.  Please note greater than half an hour spent on the discharge process of this patient, completing discharge summary, reviewing results with the patient and family, and discussing plans for further evaluation.  Thank you in advance.     Kerry Garcia, M.D.     RDP/MEDQ  D:  05/27/2010  T:  05/28/2010  Job:  [id number]  Electronically Signed by Kerry Fast Valor Quaintance M.D. on 06/22/2010 10:27:20 AM

## 2010-06-24 ENCOUNTER — Ambulatory Visit: Payer: Medicare Other

## 2010-06-29 ENCOUNTER — Ambulatory Visit: Payer: Medicare Other

## 2010-07-01 ENCOUNTER — Ambulatory Visit: Payer: Medicare Other

## 2010-07-06 ENCOUNTER — Ambulatory Visit: Payer: Medicare Other

## 2010-07-08 ENCOUNTER — Ambulatory Visit: Payer: Medicare Other

## 2010-07-14 ENCOUNTER — Encounter: Payer: BC Managed Care – PPO | Admitting: Physical Therapy

## 2010-07-21 ENCOUNTER — Ambulatory Visit
Admission: RE | Admit: 2010-07-21 | Discharge: 2010-07-21 | Disposition: A | Discharge status: Home / Self Care | Payer: Medicare Other | Source: Ambulatory Visit | Attending: Internal Medicine | Admitting: Internal Medicine

## 2010-07-21 ENCOUNTER — Other Ambulatory Visit: Payer: Self-pay | Admitting: Internal Medicine

## 2010-07-21 DIAGNOSIS — G459 Transient cerebral ischemic attack, unspecified: Secondary | ICD-10-CM

## 2010-07-31 NOTE — Op Note (Signed)
[facility]  Patient:    Kerry Garcia, Kerry Garcia                   MRN: [id number] Proc. Date: 10/05/00 Attending:  Kathreen Cosier, M.D.                           Operative Report  PREOPERATIVE DIAGNOSIS:       Postmenopausal bleeding.  POSTOPERATIVE DIAGNOSIS:      Postmenopausal bleeding.  OPERATION:                    Diagnostic Dilatation and curettage.  SURGEON:                      Kathreen Cosier, M.D.  DESCRIPTION OF PROCEDURE:     Using MAC, patient in lithotomy position, perineum and vagina prepped and draped.  Bladder emptied with straight catheter.  Bimanual exam revealed uterus to be small, negative adnexa. Weighted speculum placed in the vagina.  Anterior lip of cervix grasped with tenaculum.  The cervix was stenosed, and the pediatric dilator used to dilate the cervix initially.  Then the endocervical curetting done, small amount of tissue obtained.  Cervix dilated to #21 Shawnie Pons, and the cavity sounded to 6 cm. The endocervical curet was used to sample the endometrium.  Small amount of tissue obtained.  Then the flexible endometrial probe was used to sample the endometrium.  Small amount of tissue obtained.  The patient tolerated the procedure well and went to the recovery room in good condition. DD:  10/05/00 TD:  10/05/00 Job: [id number]

## 2010-07-31 NOTE — H&P (Signed)
[place]. [facility]  Patient:    Kerry Garcia, Kerry Garcia                   MRN: [id number] Adm. Date:  [id number] Attending:  Josie Saunders                         History and Physical  REASON FOR ADMISSION:  Chiari malformation.  HISTORY OF ILLNESS:  Kerry Garcia is a 77 year old right-handed retired woman who presented at the request of Dr. Geraldo Pitter for neurosurgical consultation for Chiari malformation.  She notes that she has had severe headaches, starting  when she was age 74 or 67, almost every day, which caused her to cry, but these  gradually resolved.  She said she has otherwise had nonsignificant headaches; she has noted recently, however, that she has developed significant problems with her balance and says that she falls to the right and falls forward.  She has noted progressive and increasingly severe numbness in both of her hands and along the  sides of her neck and upper chest.  She says she is weak in the upper part of her hips and she also notes that she has decreased ability to hold things and to pick up her feet.  She says she has had problems with bladder control but says this as actually been better recently.  She also notes problems with glaucoma and says hat she has cataracts.  She has noted some problems with her memory; she has also noted some problems with numbness around her mouth.  Kerry Garcia has noted some decline in her memory.  She had an MRI of her brain, which was performed in August 2000, which appeared to show a subacute falcine subdural hematoma which was not causing significant mass effect.  This was again demonstrated on her MRI from April of this year but this appears to be resolving and not causing any mass effect.  There is a significant Chiari I malformation ith cerebellar tonsils below the foramen magnum to the level of C2 and there is demonstration of kinking of the medulla at the  cervicomedullary junction. There does not appear to be any hydrocephalus.  There does not appear to be a significant syrinx.  No other abnormalities were noted on her cranial MRI.  REVIEW OF SYSTEMS:  Review of systems was reviewed with the patient.  Pertinent  positives are:  CONSTITUTIONAL:  She notes night sweats.  EYES:  She notes that she wears glasses.  She was diagnosed with glaucoma and cataracts.  EARS, NOSE AND THROAT:  She notes that she feels that she smells blood inside her nose.  She has had hearing loss since 1995, ear pain since 1994, ear infections since 1994, ringing in her ears since 1980.  She notes some balance disturbance, nasal congestion and drainage, inability to smell, sinus problems.  CARDIOVASCULAR: he notes rare chest pain and angina and notes that she has had prior percutaneous transluminal coronary angioplasty.  She has controlled high blood pressure, a rare irregular pulse, high cholesterol, swelling in her feet and hands.  Rarely has eg pain with walking.  RESPIRATORY:  She notes chronic cough.  She had a chest x-ray in 2000.  GASTROINTESTINAL:  She notes rare abdominal pain.  GENITOURINARY: She notes rare urinary tract infections and incontinence.  MUSCULOSKELETAL:  She notes arm weakness, leg weakness, arm pain, leg pain, arthritis and neck pain. INTEGUMENTARY AND BREASTS:  She notes skin disease and breast pain and tenderness. NEUROLOGIC:  She notes problems with her memory, disorientation, difficulty with her speech, inability to concentrate, blurred vision and right-sided facial weakness.  ENDOCRINE:  She notes excessive urination.  HEMATOLOGIC:  She notes hat she had a transfusion in 1950.  ALLERGIC:  She notes inhalant nasal allergies.  PAST MEDICAL HISTORY:  Current medical conditions:  None except as specified earlier.  Prior operations and hospitalizations:  Significant for breast biopsy in 1975 which was benign.  She notes that she  has had a lung tumor diagnosed in 1959 and 1960 which has been stable.  She has high blood pressure.  She has had two percutaneous transluminal coronary angioplasties in August of 2000; this was performed by Dr. Osvaldo Shipper. Spruill.  MEDICATIONS: 1. Timoptic eye drops 0.5 mg q.d. 2. Aricept 10 mg daily for memory loss. 3. Aspirin 325 mg daily for circulation. 4. Tylenol as needed for pain. 5. She takes an herbal supplement of cranberry for her urinary tract. 6. She takes Maxzide 25 mg one-half tablet daily.  ALLERGIES:  She denies any allergies to medications.  HEIGHT AND WEIGHT:  She is 5-feet 4-inches tall, 207 pounds.  FAMILY HISTORY:  Both parents are deceased.  There is a family history of high blood pressure in her mother and strokes in her father.  SOCIAL HISTORY:  Kerry Garcia lives with her son.  She is retired.  She is a nonsmoker and nondrinker.  No history of substance abuse.  DIAGNOSTIC STUDIES:  As above.  PHYSICAL EXAMINATION:  GENERAL APPEARANCE:  Kerry Garcia is an elderly, moderately obese black female in no acute distress.  VITAL SIGNS:  Blood pressure is 140/80, left arm, seated; pulse is 72.  HEENT:  Normocephalic, atraumatic.  The pupils are equal, round and reactive to  light.  Extraocular muscles are intact.  She has in-gaze nystagmus, right greater than the left.  Sclerae are white.  Conjunctivae pink.  Oropharynx benign. Uvula midline.  No up-gaze or down-gaze nystagmus.  NECK:  There are no masses, meningismus, deformities, tracheal deviation, jugular venous distention or carotid bruits.  There is normal cervical range of motion.  Spurlings test is negative without reproducible radicular pain, turning the patients head to either side.  Lhermittes sign is not present with axial compression.  RESPIRATORY:  There is normal respiratory effort with good intercostal function.  LUNGS:  Clear to auscultation.  There are no rales, rhonchi or  wheezes.  CARDIOVASCULAR:  The heart has regular rate and rhythm to auscultation.  No murmurs are appreciated.  There is no extremity edema, clubbing or cyanosis.  There are  palpable pedal pulses.   ABDOMEN:  Soft and nontender.  No hepatosplenomegaly appreciated or masses. There are active bowel sounds.  No guarding or rebound.  MUSCULOSKELETAL:  Kerry Garcia walks about the examining room with a wide-based staggering gait.  She tends to fall towards the right.  She occasionally falls nd has typical grasp hold to the wall for support.  NEUROLOGIC:  The patient is oriented to time, person and place.  She has good recall of both recent and remote memory with normal attention span and concentration.  The patient speaks with clear and fluent speech.  She exhibits normal language function and appropriate fund of knowledge.  Cranial nerve examination:  Pupils are equal, round and reactive to light. Extraocular movements are full.  She has nystagmus, right greater than left, on  lateral gaze.  No up- or  down-gaze nystagmus.  She has no papilledema on funduscopic examination.  She appears to have full confrontational fields on confrontational testing.  Facial sensation is slightly decreased on the right nasolabial fold and perioral region and intact on the left.  Hearing is intact o finger rub.  Palate is upgoing.  Shoulder shrug is symmetric.  Tongue protrudes in the midline.  She has an absent gag.  Motor examination:  She has no pronator drift.  She has full upper extremity strength in all motor groups.  The lower extremity strength is full in all motor groups and bilaterally symmetric.  Sensory examination reveals decreased pin sensation in the cape/shawl distribution over her chest and upper back.  She also has decreased pin sensation in both hands on the fingertips and decreased pin sensation in her lower extremities up to the level of her knees, right worse than  left.  This includes vibratory sensation, which is diminished in a similar pattern.  She has intact proprioception in her  great toes.  Deep tendon reflexes:  Reflexes are 3 in the biceps, triceps and brachioradialis; 3 at the knees; 2 at the ankles.  Great toes are downgoing to plantar stimulation.  Cerebellar examination reveals normal coordination in the upper and lower extremities with normal rapid alternating movements.  IMPRESSION AND RECOMMENDATIONS:  Kerry Garcia is a 77 year old woman with a history of prior coronary artery disease, with what appears to be a significant  peripheral neuropathy; additionally, she has a Chiari malformation with cerebellar tonsillar herniation to the level of C2.  She has complaints of increasing difficulty with her gait and staggering when she walks.  She has a cape/shawl distribution of numbness but I do think her current complaints are multifactorial and I am concerned that her gait complaints and her findings of numbness as well as an absent gag and nystagmus are secondary to Chiari malformation.  This is a significant Chiari malformation and there does appear to be some kinking of the  medulla; I have therefore recommended that she undergo surgical intervention but I explained to her in great detail that I did not think all of her symptoms were rom this problem and that she needed to understand that I could not reverse her significant peripheral neuropathy with Chiari decompression, nor could I improve her memory or vision loss with an operation.  She understands this but feels that she is getting worse with her balance and is very concerned about that and wishes to proceed to go ahead with the Chiari decompression.  I discussed this at length with her and her son and with her in great detail and answered their questions.  She wishes to go ahead with surgery as soon as possible and this has been set up for July 09, 1999.   She understands the risks that include, but are not restricted to, bleeding, infection, damage to nerves and vessel, neurologic injury, failure to improve her symptoms, cerebrospinal fluid leak, need for further surgery, stroke, brain damage or death and anesthetic-related complications.  I also explained that I wanted her to be seen by Dr. Shana Chute to be cleared for surgery to go ahead with her surgery; she did so and he cleared her for surgery.DD:  07/09/99 TD:  07/09/99 Job: [id number]

## 2010-07-31 NOTE — Discharge Summary (Signed)
[place]. [facility]  Patient:    Kerry Garcia, Kerry Garcia                   MRN: [id number] Adm. Date:  [id number] Disc. Date: [id number] Attending:  Evern Core Dictator:   Bynum Bellows Idacavage, P.A.C. CC:         Laurier Nancy, M.D.             Osvaldo Shipper. Spruill, M.D.             Geraldo Pitter, M.D.             Danae Orleans. Venetia Maxon, M.D.                           Discharge Summary  DIAGNOSES: 1. Status post Chiari decompression of C1-2 laminotomy. 2. Hypertension. 3. Coronary artery disease. 4. Memory loss. 5. Proteus and enterococcus urinary tract infection.  HISTORY OF PRESENT ILLNESS AND HOSPITAL COURSE:  The patient is a 77 year old female admitted July 09, 1999, with history of Chiari malformation, who originally presented with numbness and difficulty with balance.  A cranial MRI showed subacute ______ subdural hematoma in August 2000, which was unchanged with follow-up scan.  The patient underwent decompression of laminectomy with C1-2 and dural patch as well as microdissection by Dr. Venetia Maxon.  The patient lives with her son in a two-level home with five steps to enter, 16 steps to bathroom and bedroom.  The family works during the day.  Daughter from [place] will be staying with the patient for one week.  The patient was independent prior to admission with a cane.  She was admitted to [facility] on Jul 14, 1999, at which time she was moderate assist to sit, minimal assist sit-to-stand, and ambulating 180 feet with a rolling walker and minimal assistance.  She participated in physical and occupational therapies.  While on the rehabilitation unit a routine urinalysis had showed Proteus and enterococcus UTI, both of which were sensitive to amoxicillin, and she was started on that.  The patient has had some memory deficits at home, and she was well aware of these and used adaptive devices to manage this.  Therefore, her narcotic  pain medicine was minimized to help minimize potential mental status changes.  The patient has had no acute mental status problems while on the rehabilitation unit.  CBC from Jul 15, 1999, shows white blood count of 12.1, hemoglobin 11.6, hematocrit 35.5, platelets 301.  Routine chemistries show sodium of 138, potassium 3.7, chloride 95, CO2 34, glucose 100, BUN 22, creatinine 0.6.  At the time of discharge the patient is noted to be modified independent with bed mobility and transfers.  She is able to ambulate 300 feet with a rolling walker, and negotiates 20 steps with a rail.  DISCHARGE MEDICATIONS: 1. ______ 250 mg three times a day. 2. Vicodin 1 every four hours as needed. 3. Maxzide 1/2 daily. 4. Timoptic 1 drop daily.  DISCHARGE INSTRUCTIONS:  She is to have intermittent supervision at her sons home.  Follow a balanced diet.  Clean the wound daily.  She will have home health physical and occupational therapy.  FOLLOW-UP:  With Dr. Venetia Maxon in two weeks and Dr. Shana Chute as he instructs.  CONDITION ON DISCHARGE:  Stable. DD:  07/23/99 TD:  07/25/99 Job: [id number]

## 2010-07-31 NOTE — Op Note (Signed)
[place]. [facility]  Patient:    Kerry Garcia, Kerry Garcia                   MRN: [id number] Proc. Date: 07/09/99 Adm. Date:  [id number] Attending:  Josie Saunders                           Operative Report  PREOPERATIVE DIAGNOSIS:  Chiari 1 malformation.  POSTOPERATIVE DIAGNOSIS:  Chiari 1 malformation.  OPERATION PERFORMED:  Chiari decompression with C1 and C2 laminectomy and dural patch graft with microdissection.  SURGEON:  Danae Orleans. Venetia Maxon, M.D.  ASSISTANT:  Alanson Aly. Roxan Hockey, M.D.  ANESTHESIA:  General.  ESTIMATED BLOOD LOSS:  200 cc.  COMPLICATIONS:  None.  DISPOSITION:  Recovery.  INDICATIONS FOR PROCEDURE:  The patient is a 77 year old woman with Chiari malformation and ____________ numbness and difficulty with her balance and gait.  It was elected to admit her to the hospital for Chiari decompression.  DESCRIPTION OF PROCEDURE:  Ms. Grisanti was brought to the operating room. Following satisfactory and uncomplicated induction of general endotracheal anesthesia and placement of intravenous lines and Foley catheter.  She was placed in three-pin Mayfield head fixation, turned in neutral alignment and placed in a prone position on the operating table where her neck was flexed slightly.  Her occiput was shaved, prepped and draped in the usual sterile fashion.  The area of planned incision was infiltrated with 0.25% Marcaine and 0.5% lidocaine and 1:200,000 epinephrine.  Incision was made from just below the inion to the level of C2.  This was carried through the avascular midline plane to the suboccipital region and C1 and C2 laminae were identified and cleared of investing soft tissues.  Self-retaining retractors were placed using the perforator.  The suboccipital region just above the foramen magnum was thinned and using the craniotome and followed by the Midas Rex drill with the AM3 bur, craniectomy was then performed decompressing the  foramen magnum to the lateral extent of the cervical medullary junction and also over the cerebellar hemispheres below the transverse sinus.  There was some bony bleeding which was controlled with bone wax.  The posterior ring of C1 was then removed and the superior half of the C2 lamina was removed.  The dura was then decompressed of a fibrous band from the foramen magnum to the C1 and hemostasis was obtained with bipolar electrocautery and Gelfoam.  The microscope was then brought into the field and using microdissection technique, the dura was incised in the midline and the dural incision was continued in a Y-shaped extension overlying each of the cerebellar hemispheres, carried down to the level of C2.  The arachnoid was then opened and the cerebellar tonsils were decompressed.  Using gentle microdissection, the fourth ventricle was identified and there was felt to be unobstructed egress of spinal fluid.  A bovine pericardial patch graft was then fashioned as a dural patch and this was affixed to the dura with 4-0 Nurulon running stitches.  The patch was felt to have an occlusive fit and it was reinforced with Tisseal overlying the suture line.  Prior to the placement of Tisseal, the wound was copiously irrigated with bacitracin saline.  The deep muscular layer was reapproximated with 0 Vicryl sutures.  The posterior cervical fascia was reapproximated with 0 Vicryl sutures, subcutaneous fat was reapproximated with 2-0 Vicryl suture interrupted inverted sutures.  Skin edges were reapproximated with 3-0 nylon running locked stitch.  The wound was dressed with bacitracin, Telfa gauze and tape.  The patient was extubated in the operating room and taking to the recovery room in stable and satisfactory condition having tolerated the procedure well.  Counts were correct at the end of the case. DD:  07/09/99 TD:  07/09/99 Job: [id number]

## 2010-07-31 NOTE — Discharge Summary (Signed)
[place]  Patient:    Kerry Garcia, Kerry Garcia                   MRN: [id number] Adm. Date:  [id number] Disc. Date: [id number] Attending:  Evern Core CC:         Danae Orleans. Venetia Maxon, M.D.                           Discharge Summary  ADMISSION DIAGNOSES: 1. Chiari malformation with hypertension, status post coronary angioplasty. 2. Glaucoma. 3. Idiopathic peripheral neuropathy. 4. Atherosclerosis of native coronary vessels.  DISCHARGE DIAGNOSES: 1. Chiari malformation with hypertension, status post coronary angioplasty. 2. Glaucoma. 3. Idiopathic peripheral neuropathy. 4. Atherosclerosis of native coronary vessels.  HISTORY OF PRESENT ILLNESS:  [name] is a 77 year old woman who has had a long history of severe headaches but has now been having trouble with falling, numbness in both her hands and along the sides of her neck and upper chest.  She feels that she is weak in her legs and has decreased ability to hold things and to pick up her feet.  She was found to have a significant Chiari malformation with cerebellar tonsils to the level of C2.  It was felt that her problems with her gait and staggering were related to the Chiari malformation.  It was recommended to the patient that she undergo Chiari decompression and she did so on the same day admission basis on July 09, 1999, with a Chiari decompression and cervical laminectomy with dural patch graft.  HOSPITAL COURSE:  She tolerated her procedure well and slowly mobilized.  She was evaluated by physical therapy and the rehabilitation and was felt to be a good candidate for inpatient rehabilitation to improve her mobility with gait training.  The patient was therefore transferred to the rehabilitation service on Jul 14, 1999.  FOLLOWUP:  Follow up with Dr. Venetia Maxon upon discharge from the rehabilitation service. DD:  08/07/99 TD:  08/09/99 Job: [id number]

## 2010-11-19 ENCOUNTER — Ambulatory Visit (INDEPENDENT_AMBULATORY_CARE_PROVIDER_SITE_OTHER): Payer: Medicare Other | Admitting: Gynecology

## 2010-11-19 ENCOUNTER — Encounter: Payer: Self-pay | Admitting: Gynecology

## 2010-11-19 VITALS — BP 150/78 | Ht 63.75 in | Wt 215.0 lb

## 2010-11-19 DIAGNOSIS — A499 Bacterial infection, unspecified: Secondary | ICD-10-CM

## 2010-11-19 DIAGNOSIS — R102 Pelvic and perineal pain: Secondary | ICD-10-CM

## 2010-11-19 DIAGNOSIS — R32 Unspecified urinary incontinence: Secondary | ICD-10-CM | POA: Insufficient documentation

## 2010-11-19 DIAGNOSIS — I10 Essential (primary) hypertension: Secondary | ICD-10-CM | POA: Insufficient documentation

## 2010-11-19 DIAGNOSIS — F329 Major depressive disorder, single episode, unspecified: Secondary | ICD-10-CM | POA: Insufficient documentation

## 2010-11-19 DIAGNOSIS — N898 Other specified noninflammatory disorders of vagina: Secondary | ICD-10-CM

## 2010-11-19 DIAGNOSIS — E042 Nontoxic multinodular goiter: Secondary | ICD-10-CM | POA: Insufficient documentation

## 2010-11-19 DIAGNOSIS — B373 Candidiasis of vulva and vagina: Secondary | ICD-10-CM

## 2010-11-19 DIAGNOSIS — N760 Acute vaginitis: Secondary | ICD-10-CM

## 2010-11-19 DIAGNOSIS — R82998 Other abnormal findings in urine: Secondary | ICD-10-CM

## 2010-11-19 DIAGNOSIS — B9689 Other specified bacterial agents as the cause of diseases classified elsewhere: Secondary | ICD-10-CM

## 2010-11-19 DIAGNOSIS — N949 Unspecified condition associated with female genital organs and menstrual cycle: Secondary | ICD-10-CM

## 2010-11-19 DIAGNOSIS — R350 Frequency of micturition: Secondary | ICD-10-CM

## 2010-11-19 DIAGNOSIS — N952 Postmenopausal atrophic vaginitis: Secondary | ICD-10-CM

## 2010-11-19 MED ORDER — FLUCONAZOLE 150 MG PO TABS: 150.0000 mg | ORAL_TABLET | Freq: Once | ORAL | Status: AC

## 2010-11-19 MED ORDER — METRONIDAZOLE 500 MG PO TABS: 500.0000 mg | ORAL_TABLET | Freq: Two times a day (BID) | ORAL | Status: AC

## 2010-11-19 NOTE — Progress Notes (Signed)
Patient presents as a new patient complaining of several month history of vaginal discharge. She notes in her underwear a darkish discolored discharge. It's not bloody but just a little darker. No itching, some odor. Does also note some urinary frequency at night apparently was treated for UTI by her primary for similar complaints said that her symptoms got better but now they seem to have returned to. No dysuria or urgency. No constipation diarrhea or constitutional symptoms such as fever or chills. She does note a little lower abdominal discomfort nagging in nature suprapubically.  She normally sees her primary just for routine health maintenance and states she is up to date as far as mammograms. She cannot remember when her last colonoscopy was. Her last GYN exam had been a number of years ago. She has no history of abnormal Pap smears or any gynecologic issues in the past.  Exam directed to complaint Abdomen: Soft nontender without masses guarding rebound organomegaly Pelvic: External with atrophic genital changes vagina with thick cottage cheese discharge wet prep done cervix flush with the upper vagina bimanual uterus grossly normal midline mobile nontender adnexa without masses or tenderness rectovaginal exam is normal.  Assessment and plan: #1 Vaginal discharge.  KOH wet prep is positive for yeast and BV. We'll treat with Diflucan 150x1 dose and Flagyl 500 twice a day x7 days alcohol avoidance. Follow up if symptoms persist or recur. #2 Urinary frequency suprapubic discomfort. We'll check UA and treat accordingly. Ordered she went ultrasound to rule out ovarian process and to look at endometrial echo. Patient will schedule a followup for this. Patient will continue to see her primary for routine health maintenance.

## 2010-11-19 NOTE — Progress Notes (Signed)
Addended by: Landis Martins R on: 11/19/2010 11:16 AM   Modules accepted: Orders

## 2011-04-13 DIAGNOSIS — H11439 Conjunctival hyperemia, unspecified eye: Secondary | ICD-10-CM | POA: Diagnosis not present

## 2011-04-13 DIAGNOSIS — R51 Headache: Secondary | ICD-10-CM | POA: Diagnosis not present

## 2011-04-13 DIAGNOSIS — H01009 Unspecified blepharitis unspecified eye, unspecified eyelid: Secondary | ICD-10-CM | POA: Diagnosis not present

## 2011-04-13 DIAGNOSIS — H1130 Conjunctival hemorrhage, unspecified eye: Secondary | ICD-10-CM | POA: Diagnosis not present

## 2011-04-27 DIAGNOSIS — R609 Edema, unspecified: Secondary | ICD-10-CM | POA: Diagnosis not present

## 2011-04-30 DIAGNOSIS — R269 Unspecified abnormalities of gait and mobility: Secondary | ICD-10-CM | POA: Diagnosis not present

## 2011-04-30 DIAGNOSIS — R209 Unspecified disturbances of skin sensation: Secondary | ICD-10-CM | POA: Diagnosis not present

## 2011-04-30 DIAGNOSIS — IMO0001 Reserved for inherently not codable concepts without codable children: Secondary | ICD-10-CM | POA: Diagnosis not present

## 2011-04-30 DIAGNOSIS — M25569 Pain in unspecified knee: Secondary | ICD-10-CM | POA: Diagnosis not present

## 2011-05-13 DIAGNOSIS — R209 Unspecified disturbances of skin sensation: Secondary | ICD-10-CM | POA: Diagnosis not present

## 2011-05-25 DIAGNOSIS — R609 Edema, unspecified: Secondary | ICD-10-CM | POA: Diagnosis not present

## 2011-06-04 DIAGNOSIS — F329 Major depressive disorder, single episode, unspecified: Secondary | ICD-10-CM | POA: Diagnosis not present

## 2011-06-14 DIAGNOSIS — Z961 Presence of intraocular lens: Secondary | ICD-10-CM | POA: Diagnosis not present

## 2011-06-14 DIAGNOSIS — H409 Unspecified glaucoma: Secondary | ICD-10-CM | POA: Diagnosis not present

## 2011-06-14 DIAGNOSIS — H524 Presbyopia: Secondary | ICD-10-CM | POA: Diagnosis not present

## 2011-06-14 DIAGNOSIS — H4011X0 Primary open-angle glaucoma, stage unspecified: Secondary | ICD-10-CM | POA: Diagnosis not present

## 2011-06-14 DIAGNOSIS — H01009 Unspecified blepharitis unspecified eye, unspecified eyelid: Secondary | ICD-10-CM | POA: Diagnosis not present

## 2011-07-02 DIAGNOSIS — F329 Major depressive disorder, single episode, unspecified: Secondary | ICD-10-CM | POA: Diagnosis not present

## 2011-08-16 DIAGNOSIS — Z961 Presence of intraocular lens: Secondary | ICD-10-CM | POA: Diagnosis not present

## 2011-08-16 DIAGNOSIS — H01009 Unspecified blepharitis unspecified eye, unspecified eyelid: Secondary | ICD-10-CM | POA: Diagnosis not present

## 2011-08-16 DIAGNOSIS — H4011X0 Primary open-angle glaucoma, stage unspecified: Secondary | ICD-10-CM | POA: Diagnosis not present

## 2011-08-16 DIAGNOSIS — H04129 Dry eye syndrome of unspecified lacrimal gland: Secondary | ICD-10-CM | POA: Diagnosis not present

## 2011-08-16 DIAGNOSIS — H409 Unspecified glaucoma: Secondary | ICD-10-CM | POA: Diagnosis not present

## 2011-08-25 DIAGNOSIS — I10 Essential (primary) hypertension: Secondary | ICD-10-CM | POA: Diagnosis not present

## 2011-08-25 DIAGNOSIS — F329 Major depressive disorder, single episode, unspecified: Secondary | ICD-10-CM | POA: Diagnosis not present

## 2011-09-02 DIAGNOSIS — R209 Unspecified disturbances of skin sensation: Secondary | ICD-10-CM | POA: Diagnosis not present

## 2011-09-02 DIAGNOSIS — IMO0001 Reserved for inherently not codable concepts without codable children: Secondary | ICD-10-CM | POA: Diagnosis not present

## 2011-09-02 DIAGNOSIS — M25569 Pain in unspecified knee: Secondary | ICD-10-CM | POA: Diagnosis not present

## 2011-09-02 DIAGNOSIS — R269 Unspecified abnormalities of gait and mobility: Secondary | ICD-10-CM | POA: Diagnosis not present

## 2012-01-04 DIAGNOSIS — R234 Changes in skin texture: Secondary | ICD-10-CM | POA: Diagnosis not present

## 2012-01-04 DIAGNOSIS — L219 Seborrheic dermatitis, unspecified: Secondary | ICD-10-CM | POA: Diagnosis not present

## 2012-01-04 DIAGNOSIS — L821 Other seborrheic keratosis: Secondary | ICD-10-CM | POA: Diagnosis not present

## 2012-01-04 DIAGNOSIS — D239 Other benign neoplasm of skin, unspecified: Secondary | ICD-10-CM | POA: Diagnosis not present

## 2012-01-11 DIAGNOSIS — R946 Abnormal results of thyroid function studies: Secondary | ICD-10-CM | POA: Diagnosis not present

## 2012-01-13 DIAGNOSIS — I10 Essential (primary) hypertension: Secondary | ICD-10-CM | POA: Diagnosis not present

## 2012-01-13 DIAGNOSIS — R946 Abnormal results of thyroid function studies: Secondary | ICD-10-CM | POA: Diagnosis not present

## 2012-01-13 DIAGNOSIS — E042 Nontoxic multinodular goiter: Secondary | ICD-10-CM | POA: Diagnosis not present

## 2012-01-13 DIAGNOSIS — T783XXA Angioneurotic edema, initial encounter: Secondary | ICD-10-CM | POA: Diagnosis not present

## 2012-01-21 DIAGNOSIS — T783XXA Angioneurotic edema, initial encounter: Secondary | ICD-10-CM | POA: Diagnosis not present

## 2012-01-21 DIAGNOSIS — L509 Urticaria, unspecified: Secondary | ICD-10-CM | POA: Diagnosis not present

## 2012-01-21 DIAGNOSIS — T7800XA Anaphylactic reaction due to unspecified food, initial encounter: Secondary | ICD-10-CM | POA: Diagnosis not present

## 2012-01-21 DIAGNOSIS — T7840XA Allergy, unspecified, initial encounter: Secondary | ICD-10-CM | POA: Diagnosis not present

## 2012-01-28 DIAGNOSIS — I10 Essential (primary) hypertension: Secondary | ICD-10-CM | POA: Diagnosis not present

## 2012-05-18 DIAGNOSIS — B351 Tinea unguium: Secondary | ICD-10-CM | POA: Diagnosis not present

## 2012-05-18 DIAGNOSIS — M79609 Pain in unspecified limb: Secondary | ICD-10-CM | POA: Diagnosis not present

## 2012-06-15 DIAGNOSIS — H409 Unspecified glaucoma: Secondary | ICD-10-CM | POA: Diagnosis not present

## 2012-06-15 DIAGNOSIS — H04129 Dry eye syndrome of unspecified lacrimal gland: Secondary | ICD-10-CM | POA: Diagnosis not present

## 2012-06-15 DIAGNOSIS — H4011X0 Primary open-angle glaucoma, stage unspecified: Secondary | ICD-10-CM | POA: Diagnosis not present

## 2012-08-10 DIAGNOSIS — M79609 Pain in unspecified limb: Secondary | ICD-10-CM | POA: Diagnosis not present

## 2012-08-10 DIAGNOSIS — B351 Tinea unguium: Secondary | ICD-10-CM | POA: Diagnosis not present

## 2012-08-15 DIAGNOSIS — D239 Other benign neoplasm of skin, unspecified: Secondary | ICD-10-CM | POA: Diagnosis not present

## 2012-08-15 DIAGNOSIS — L219 Seborrheic dermatitis, unspecified: Secondary | ICD-10-CM | POA: Diagnosis not present

## 2012-08-15 DIAGNOSIS — D2370 Other benign neoplasm of skin of unspecified lower limb, including hip: Secondary | ICD-10-CM | POA: Diagnosis not present

## 2012-08-15 DIAGNOSIS — L821 Other seborrheic keratosis: Secondary | ICD-10-CM | POA: Diagnosis not present

## 2012-08-16 DIAGNOSIS — H409 Unspecified glaucoma: Secondary | ICD-10-CM | POA: Diagnosis not present

## 2012-08-16 DIAGNOSIS — H04129 Dry eye syndrome of unspecified lacrimal gland: Secondary | ICD-10-CM | POA: Diagnosis not present

## 2012-08-16 DIAGNOSIS — H4011X0 Primary open-angle glaucoma, stage unspecified: Secondary | ICD-10-CM | POA: Diagnosis not present

## 2012-11-02 DIAGNOSIS — M79609 Pain in unspecified limb: Secondary | ICD-10-CM | POA: Diagnosis not present

## 2012-11-02 DIAGNOSIS — B351 Tinea unguium: Secondary | ICD-10-CM | POA: Diagnosis not present

## 2013-01-09 ENCOUNTER — Other Ambulatory Visit: Payer: Medicare Other

## 2013-01-15 ENCOUNTER — Ambulatory Visit: Payer: Medicare Other | Admitting: Endocrinology

## 2013-01-16 ENCOUNTER — Encounter: Payer: Self-pay | Admitting: Endocrinology

## 2013-01-16 ENCOUNTER — Ambulatory Visit (INDEPENDENT_AMBULATORY_CARE_PROVIDER_SITE_OTHER): Payer: Medicare Other | Admitting: Endocrinology

## 2013-01-16 VITALS — BP 132/70 | HR 91 | Temp 98.4°F | Resp 12 | Ht 64.0 in | Wt 203.0 lb

## 2013-01-16 DIAGNOSIS — E042 Nontoxic multinodular goiter: Secondary | ICD-10-CM | POA: Diagnosis not present

## 2013-01-16 NOTE — Progress Notes (Signed)
Patient ID: Kerry Garcia, female   DOB: [birth year], 79 y.o.   MRN: [id number]   Reason for Appointment: Goiter, followup   History of Present Illness:   The patient's thyroid enlargement was first discovered several years ago before she moved to [facility] Details of her previous evaluation are not available She has been observed without any treatment since her TSH has been slightly low previously but stable  She does not complain of any palpitations, heat intolerance or unusual weight loss  She has had no difficulty with swallowing  Does not feel like she has any choking sensation in her neck or pressure in any position or when lying down.  Lab Results  Component Value Date   TSH 0.20* 01/16/2013   TSH 0.220* 05/24/2010   FREET4 0.83 01/16/2013   FREET4 1.18 05/24/2010    She has had ultrasound exam in 2012 which showed: Two focal nodules in the left lobe measure 1.9 x 1.0 x 1.5 cm and 1.3 x 1.3 x 1.2 cm and are predominately solid.  The heterogeneous right lobe itself appears as one large nodule.      Medication List       This list is accurate as of: 01/16/13 11:59 PM.  Always use your most recent med list.               acetaminophen 325 MG tablet  Commonly known as:  TYLENOL  Take 650 mg by mouth every 6 (six) hours as needed.     aspirin 325 MG tablet  Take 325 mg by mouth daily.     DULoxetine 30 MG capsule  Commonly known as:  CYMBALTA  Take 30 mg by mouth daily.     ketotifen 0.025 % ophthalmic solution  Commonly known as:  ZADITOR  1 drop 2 (two) times daily.     multivitamin capsule  Take 1 capsule by mouth daily.     triamterene-hydrochlorothiazide 37.5-25 MG per tablet  Commonly known as:  MAXZIDE-25     valsartan-hydrochlorothiazide 160-12.5 MG per tablet  Commonly known as:  DIOVAN-HCT  Take 1 tablet by mouth daily.        Allergies: No Known Allergies  Past Medical History  Diagnosis Date  . Hypertension   . Incontinence   .  Hypothyroidism   . Depression     Past Surgical History  Procedure Laterality Date  . Spine surgery    . Breast lumpectomy  1965    Family History  Problem Relation Age of Onset  . Heart failure Father   . Prostate cancer Son     Social History:  reports that she has never smoked. She has never used smokeless tobacco. She reports that she does not drink alcohol or use illicit drugs.   Review of Systems:  CARDIOLOGY:  she has a  history of high blood pressure.            ENDOCRINOLOGY:  no history of Diabetes.     History of depression present         Examination:   BP 132/70  Pulse 91  Temp(Src) 98.4 F (36.9 C)  Resp 12  Ht 5\' 4"  (1.626 m)  Wt 203 lb (92.08 kg)  BMI 34.83 kg/m2  SpO2 95%  LMP 03/16/1963   General Appearance: pleasant,          Eyes: No abnormal prominence or eyelid swelling.          Neck: The thyroid is enlarged about 2-2.5x  normal on the right and about 1-1/2 times normal on the left, firm and nodular especially on the left No stridor There is no lymphadenopathy .    Neurological: REFLEXES: at biceps are brisk.  Skin: no rash        Assessment/Plan:  Multinodular goiter, long-standing, with autonomous function and persistently low TSH Since her free T4 is again quite normal will  continue to monitor annually She is not have any local pressure symptoms from her goiter and at her age will not need to consider surgery   Katja Blue 12/13/2012, 11:25 AM

## 2013-05-22 DIAGNOSIS — M79609 Pain in unspecified limb: Secondary | ICD-10-CM | POA: Diagnosis not present

## 2013-05-22 DIAGNOSIS — B351 Tinea unguium: Secondary | ICD-10-CM | POA: Diagnosis not present

## 2013-06-29 DIAGNOSIS — Z961 Presence of intraocular lens: Secondary | ICD-10-CM | POA: Diagnosis not present

## 2013-06-29 DIAGNOSIS — H524 Presbyopia: Secondary | ICD-10-CM | POA: Diagnosis not present

## 2013-06-29 DIAGNOSIS — H409 Unspecified glaucoma: Secondary | ICD-10-CM | POA: Diagnosis not present

## 2013-06-29 DIAGNOSIS — H4011X0 Primary open-angle glaucoma, stage unspecified: Secondary | ICD-10-CM | POA: Diagnosis not present

## 2013-08-07 DIAGNOSIS — R946 Abnormal results of thyroid function studies: Secondary | ICD-10-CM | POA: Diagnosis not present

## 2013-08-07 DIAGNOSIS — E785 Hyperlipidemia, unspecified: Secondary | ICD-10-CM | POA: Diagnosis not present

## 2013-08-07 DIAGNOSIS — I10 Essential (primary) hypertension: Secondary | ICD-10-CM | POA: Diagnosis not present

## 2013-08-13 DIAGNOSIS — R599 Enlarged lymph nodes, unspecified: Secondary | ICD-10-CM | POA: Diagnosis not present

## 2013-08-14 DIAGNOSIS — M79609 Pain in unspecified limb: Secondary | ICD-10-CM | POA: Diagnosis not present

## 2013-08-14 DIAGNOSIS — B351 Tinea unguium: Secondary | ICD-10-CM | POA: Diagnosis not present

## 2013-08-21 DIAGNOSIS — H409 Unspecified glaucoma: Secondary | ICD-10-CM | POA: Diagnosis not present

## 2013-08-21 DIAGNOSIS — H4011X0 Primary open-angle glaucoma, stage unspecified: Secondary | ICD-10-CM | POA: Diagnosis not present

## 2013-08-30 DIAGNOSIS — D239 Other benign neoplasm of skin, unspecified: Secondary | ICD-10-CM | POA: Diagnosis not present

## 2013-08-30 DIAGNOSIS — L708 Other acne: Secondary | ICD-10-CM | POA: Diagnosis not present

## 2013-08-30 DIAGNOSIS — L219 Seborrheic dermatitis, unspecified: Secondary | ICD-10-CM | POA: Diagnosis not present

## 2013-11-06 DIAGNOSIS — M79609 Pain in unspecified limb: Secondary | ICD-10-CM | POA: Diagnosis not present

## 2013-11-06 DIAGNOSIS — B351 Tinea unguium: Secondary | ICD-10-CM | POA: Diagnosis not present

## 2014-01-14 ENCOUNTER — Encounter: Payer: Self-pay | Admitting: Endocrinology

## 2014-01-29 DIAGNOSIS — M79674 Pain in right toe(s): Secondary | ICD-10-CM | POA: Diagnosis not present

## 2014-01-29 DIAGNOSIS — B351 Tinea unguium: Secondary | ICD-10-CM | POA: Diagnosis not present

## 2014-01-29 DIAGNOSIS — Z0001 Encounter for general adult medical examination with abnormal findings: Secondary | ICD-10-CM | POA: Diagnosis not present

## 2014-01-29 DIAGNOSIS — E784 Other hyperlipidemia: Secondary | ICD-10-CM | POA: Diagnosis not present

## 2014-01-29 DIAGNOSIS — Z1389 Encounter for screening for other disorder: Secondary | ICD-10-CM | POA: Diagnosis not present

## 2014-01-29 DIAGNOSIS — E042 Nontoxic multinodular goiter: Secondary | ICD-10-CM | POA: Diagnosis not present

## 2014-01-29 DIAGNOSIS — M79675 Pain in left toe(s): Secondary | ICD-10-CM | POA: Diagnosis not present

## 2014-01-29 DIAGNOSIS — I10 Essential (primary) hypertension: Secondary | ICD-10-CM | POA: Diagnosis not present

## 2014-02-06 DIAGNOSIS — Z1211 Encounter for screening for malignant neoplasm of colon: Secondary | ICD-10-CM | POA: Diagnosis not present

## 2014-05-02 DIAGNOSIS — M545 Low back pain: Secondary | ICD-10-CM | POA: Diagnosis not present

## 2014-05-13 DIAGNOSIS — B351 Tinea unguium: Secondary | ICD-10-CM | POA: Diagnosis not present

## 2014-05-13 DIAGNOSIS — M79675 Pain in left toe(s): Secondary | ICD-10-CM | POA: Diagnosis not present

## 2014-05-13 DIAGNOSIS — M79674 Pain in right toe(s): Secondary | ICD-10-CM | POA: Diagnosis not present

## 2014-07-30 DIAGNOSIS — F329 Major depressive disorder, single episode, unspecified: Secondary | ICD-10-CM | POA: Diagnosis not present

## 2014-07-30 DIAGNOSIS — I10 Essential (primary) hypertension: Secondary | ICD-10-CM | POA: Diagnosis not present

## 2014-08-05 DIAGNOSIS — M79675 Pain in left toe(s): Secondary | ICD-10-CM | POA: Diagnosis not present

## 2014-08-05 DIAGNOSIS — B351 Tinea unguium: Secondary | ICD-10-CM | POA: Diagnosis not present

## 2014-08-05 DIAGNOSIS — M79674 Pain in right toe(s): Secondary | ICD-10-CM | POA: Diagnosis not present

## 2014-09-26 DIAGNOSIS — D239 Other benign neoplasm of skin, unspecified: Secondary | ICD-10-CM | POA: Diagnosis not present

## 2014-09-26 DIAGNOSIS — D2271 Melanocytic nevi of right lower limb, including hip: Secondary | ICD-10-CM | POA: Diagnosis not present

## 2014-09-26 DIAGNOSIS — D225 Melanocytic nevi of trunk: Secondary | ICD-10-CM | POA: Diagnosis not present

## 2014-09-26 DIAGNOSIS — F329 Major depressive disorder, single episode, unspecified: Secondary | ICD-10-CM | POA: Diagnosis not present

## 2014-09-26 DIAGNOSIS — D2230 Melanocytic nevi of unspecified part of face: Secondary | ICD-10-CM | POA: Diagnosis not present

## 2014-09-26 DIAGNOSIS — L723 Sebaceous cyst: Secondary | ICD-10-CM | POA: Diagnosis not present

## 2014-09-26 DIAGNOSIS — D224 Melanocytic nevi of scalp and neck: Secondary | ICD-10-CM | POA: Diagnosis not present

## 2014-09-26 DIAGNOSIS — L821 Other seborrheic keratosis: Secondary | ICD-10-CM | POA: Diagnosis not present

## 2014-10-18 ENCOUNTER — Encounter: Payer: Self-pay | Admitting: Podiatry

## 2014-10-18 ENCOUNTER — Ambulatory Visit (INDEPENDENT_AMBULATORY_CARE_PROVIDER_SITE_OTHER): Payer: Medicare Other | Admitting: Podiatry

## 2014-10-18 VITALS — BP 129/65 | HR 70 | Resp 16

## 2014-10-18 DIAGNOSIS — M79673 Pain in unspecified foot: Secondary | ICD-10-CM

## 2014-10-18 DIAGNOSIS — I739 Peripheral vascular disease, unspecified: Secondary | ICD-10-CM

## 2014-10-18 DIAGNOSIS — M79609 Pain in unspecified limb: Secondary | ICD-10-CM

## 2014-10-18 DIAGNOSIS — B351 Tinea unguium: Secondary | ICD-10-CM

## 2014-10-18 NOTE — Progress Notes (Signed)
Patient ID: Kerry Garcia, female   DOB: 1925-06-11, 81 y.o.   MRN: [id number] Complaint:  Visit Type: Patient returns to my office for continued preventative foot care services. Complaint: Patient states" my nails have grown long and thick and become painful to walk and wear shoes" . The patient presents for preventative foot care services. No changes to ROS  Podiatric Exam: Vascular: dorsalis pedis pulses are palpable bilateral. Her posterior tibial pulses are non palpable. . Capillary return is immediate. Temperature gradient is WNL. Skin turgor WNL  Sensorium: Normal Semmes Weinstein monofilament test. Normal tactile sensation bilaterally. Nail Exam: Pt has thick disfigured discolored nails with subungual debris noted bilateral entire nail hallux through fifth toenails Ulcer Exam: There is no evidence of ulcer or pre-ulcerative changes or infection. Orthopedic Exam: Muscle tone and strength are WNL. No limitations in general ROM. No crepitus or effusions noted. Foot type and digits show no abnormalities. Bony prominences are unremarkable. Skin: No Porokeratosis. No infection or ulcers  Diagnosis:  Onychomycosis, , Pain in right toe, pain in left toes  Treatment & Plan Procedures and Treatment: Consent by patient was obtained for treatment procedures. The patient understood the discussion of treatment and procedures well. All questions were answered thoroughly reviewed. Debridement of mycotic and hypertrophic toenails, 1 through 5 bilateral and clearing of subungual debris. No ulceration, no infection noted.  Return Visit-Office Procedure: Patient instructed to return to the office for a follow up visit 3 months for continued evaluation and treatment.

## 2014-12-16 DIAGNOSIS — H3561 Retinal hemorrhage, right eye: Secondary | ICD-10-CM | POA: Diagnosis not present

## 2014-12-16 DIAGNOSIS — H35359 Cystoid macular degeneration, unspecified eye: Secondary | ICD-10-CM | POA: Diagnosis not present

## 2014-12-18 DIAGNOSIS — R600 Localized edema: Secondary | ICD-10-CM | POA: Diagnosis not present

## 2014-12-19 DIAGNOSIS — R600 Localized edema: Secondary | ICD-10-CM | POA: Diagnosis not present

## 2014-12-19 DIAGNOSIS — Z23 Encounter for immunization: Secondary | ICD-10-CM | POA: Diagnosis not present

## 2014-12-30 ENCOUNTER — Encounter (INDEPENDENT_AMBULATORY_CARE_PROVIDER_SITE_OTHER): Payer: Medicare Other | Admitting: Ophthalmology

## 2014-12-30 DIAGNOSIS — I10 Essential (primary) hypertension: Secondary | ICD-10-CM

## 2014-12-30 DIAGNOSIS — H34831 Tributary (branch) retinal vein occlusion, right eye, with macular edema: Secondary | ICD-10-CM | POA: Diagnosis not present

## 2014-12-30 DIAGNOSIS — H35033 Hypertensive retinopathy, bilateral: Secondary | ICD-10-CM

## 2014-12-30 DIAGNOSIS — H43813 Vitreous degeneration, bilateral: Secondary | ICD-10-CM | POA: Diagnosis not present

## 2015-01-02 DIAGNOSIS — R269 Unspecified abnormalities of gait and mobility: Secondary | ICD-10-CM | POA: Diagnosis not present

## 2015-01-14 ENCOUNTER — Ambulatory Visit: Payer: Medicare Other | Attending: Internal Medicine | Admitting: Physical Therapy

## 2015-01-14 DIAGNOSIS — R29898 Other symptoms and signs involving the musculoskeletal system: Secondary | ICD-10-CM | POA: Insufficient documentation

## 2015-01-14 DIAGNOSIS — R269 Unspecified abnormalities of gait and mobility: Secondary | ICD-10-CM | POA: Diagnosis not present

## 2015-01-14 NOTE — Patient Instructions (Addendum)
Seated hamstring and piriformis stretch Seated marching, LAQ, ankle pumps

## 2015-01-14 NOTE — Therapy (Signed)
[facility] [address] [place], [place], [address] Phone: [phone]   Fax:  [phone]  Physical Therapy Evaluation  Patient Details  Name: Kerry Garcia MRN: [id number] Date of Birth: 1926/03/01 Referring Provider: Delfina Redwood  Encounter Date: 01/14/2015      PT End of Session - 01/14/15 1020    Visit Number 1   Date for PT Re-Evaluation 03/11/15   PT Start Time 0930   PT Stop Time 7654   PT Time Calculation (min) 45 min   Activity Tolerance Patient tolerated treatment well   Behavior During Therapy [facility] for tasks assessed/performed      Past Medical History  Diagnosis Date  . Hypertension   . Incontinence   . Hypothyroidism   . Depression     Past Surgical History  Procedure Laterality Date  . Spine surgery    . Breast lumpectomy  1965    There were no vitals filed for this visit.  Visit Diagnosis:  Weakness of both lower extremities - Plan: PT plan of care cert/re-cert  Abnormality of gait - Plan: PT plan of care cert/re-cert      Subjective Assessment - 01/14/15 0949    Pertinent History 12/30/14 pt had eye surgery due to broken blood vessel in eye, pt with h/o dizziness/vertigo            [facility] PT Assessment - 01/14/15 0001    Assessment   Medical Diagnosis gait difficulty   Referring Provider Polite   Precautions   Precautions None   Restrictions   Weight Bearing Restrictions No   Balance Screen   Has the patient fallen in the past 6 months No   [place] One level   Prior Function   Level of Independence Independent   Cognition   Overall Cognitive Status Within Functional Limits for tasks assessed   ROM / Strength   AROM / PROM / Strength AROM;Strength   AROM   Overall AROM Comments lumbar ext limited 50%, lumbar rotation limited 50% bilat, pain with Rt sidebend   Strength   Overall Strength Comments LE strength grossly 3/5   Ambulation/Gait   Assistive device Straight cane   Standardized Balance Assessment   Standardized Balance Assessment Berg Balance Test   Berg Balance Test   Sit to Stand Able to stand using hands after several tries   Standing Unsupported Able to stand 2 minutes with supervision   Sitting with Back Unsupported but Feet Supported on Floor or Stool Able to sit safely and securely 2 minutes   Stand to Sit Uses backs of legs against chair to control descent   Transfers Able to transfer safely, definite need of hands   Standing Unsupported with Eyes Closed Able to stand 10 seconds with supervision   Standing Ubsupported with Feet Together Needs help to attain position but able to stand for 30 seconds with feet together   From Standing, Reach Forward with Outstretched Arm Reaches forward but needs supervision   From Standing Position, Pick up Object from Floor Unable to pick up and needs supervision   From Standing Position, Turn to Look Behind Over each Shoulder Turn sideways only but maintains balance   Turn 360 Degrees Needs assistance while turning   Standing Unsupported, Alternately Place Feet on Step/Stool Needs assistance to keep from falling or unable to try   Standing Unsupported, One Foot in [place]  Able to take small step independently and hold 30 seconds   Standing on One Leg Unable to try or needs assist to prevent fall   Total Score 24                           PT Education - 01-23-15 1019    Education provided Yes   Education Details HEP, PT POC   Person(s) Educated Patient   Methods Explanation;Demonstration;Handout   Comprehension Verbalized understanding;Returned demonstration          PT Short Term Goals - 01/23/2015 1024    PT SHORT TERM GOAL #1   Title Pt will be independent in initial HEP   Time 4   Period Weeks   Status New   PT SHORT TERM GOAL #2   Title Pt will improve BERG by 5 points to  demo improved balance   Time 4   Period Weeks   Status New   PT SHORT TERM GOAL #3   Title Pt will tolerate walking x 5 minutes without rest breaks   Time 4   Period Weeks   Status New           PT Long Term Goals - 01/23/15 1025    PT LONG TERM GOAL #1   Title Pt will be independent in HEP   Time 8   Period Weeks   Status New   PT LONG TERM GOAL #2   Title Pt will improve BERG to 35/56 to demo improved balance   Time 8   Period Weeks   Status New   PT LONG TERM GOAL #3   Title Pt will tolerate gait x 10 minutes without rest breaks   Time 8   Period Weeks   Status New               Plan - 01/23/15 1021    Clinical Impression Statement Pt presents with weakness, decreased balance and decreased functional mobility and impaired gait. Pt will benefit from skilled PT to address deficits and increase functional mobility.   Pt will benefit from skilled therapeutic intervention in order to improve on the following deficits Abnormal gait;Decreased strength;Difficulty walking;Decreased mobility;Decreased range of motion;Decreased balance;Decreased endurance;Pain;Impaired flexibility;Increased muscle spasms   Rehab Potential Good   PT Frequency 2x / week   PT Duration 8 weeks   PT Treatment/Interventions ADLs/Self Care Home Management;Cryotherapy;Electrical Stimulation;Moist Heat;Patient/family education;Taping;Energy conservation;Passive range of motion;Neuromuscular re-education;Balance training;Therapeutic exercise;Therapeutic activities;Functional mobility training;Stair training;Gait training;DME Instruction;Manual techniques   PT Next Visit Plan try 5 times sit to stand. Work on LE strength and balance. review HEP   PT Home Exercise Plan seated strength and stretching   Consulted and Agree with Plan of Care Patient          G-Codes - 2015-01-23 1027    Functional Assessment Tool Used clinical judgement   Functional Limitation Mobility: Walking and moving around    Mobility: Walking and Moving Around Current Status [phone]) At least 40 percent but less than 60 percent impaired, limited or restricted   Mobility: Walking and Moving Around Goal Status [phone]) At least 20 percent but less than 40 percent impaired, limited or restricted       Problem List Patient Active Problem List   Diagnosis Date Noted  . Hypertension   . Incontinence   . Multinodular goiter   . Depression   . CLAUDICATION 12/02/2009  . CHEST PAIN 12/02/2009    Isabelle Course,  PT, DPT  01/14/2015, 10:31 AM  [facility] [address] [place], [place], [address] Phone: [phone]   Fax:  [phone]  Name: Kerry Garcia MRN: [id number] Date of Birth: 08-03-1925

## 2015-01-16 ENCOUNTER — Ambulatory Visit: Payer: Medicare Other | Admitting: Physical Therapy

## 2015-01-16 DIAGNOSIS — R269 Unspecified abnormalities of gait and mobility: Secondary | ICD-10-CM | POA: Diagnosis not present

## 2015-01-16 DIAGNOSIS — R29898 Other symptoms and signs involving the musculoskeletal system: Secondary | ICD-10-CM | POA: Diagnosis not present

## 2015-01-16 NOTE — Therapy (Signed)
[facility] [address] [place], [place], [address] Phone: [phone]   Fax:  [phone]  Physical Therapy Treatment  Patient Details  Name: Kerry Garcia MRN: [id number] Date of Birth: March 05, 1926 Referring Provider: Delfina Redwood  Encounter Date: 01/16/2015      PT End of Session - 01/16/15 1009    Visit Number 2   Date for PT Re-Evaluation 03/11/15   PT Start Time 0930   PT Stop Time 0350   PT Time Calculation (min) 45 min   Activity Tolerance Patient tolerated treatment well   Behavior During Therapy [facility] for tasks assessed/performed      Past Medical History  Diagnosis Date  . Hypertension   . Incontinence   . Hypothyroidism   . Depression     Past Surgical History  Procedure Laterality Date  . Spine surgery    . Breast lumpectomy  1965    There were no vitals filed for this visit.  Visit Diagnosis:  Weakness of both lower extremities  Abnormality of gait      Subjective Assessment - 01/16/15 0935    Subjective Pt states her knees are sore today, she has performed her HEP with no increase in pain.   Currently in Pain? Yes   Pain Score 3    Pain Location Knee   Pain Orientation Left;Right   Pain Descriptors / Indicators Aching                         [facility] Adult PT Treatment/Exercise - 01/16/15 0001    Standardized Balance Assessment   Standardized Balance Assessment --  5 times sit to stand 47 seconds   High Level Balance   High Level Balance Activities Side stepping;Backward walking  with CGA   High Level Balance Comments --  square step with CGA x 10   Exercises   Exercises Knee/Hip   Knee/Hip Exercises: Stretches   Active Hamstring Stretch Both;2 reps;30 seconds  seated   Piriformis Stretch Both;2 reps;30 seconds  seated   Knee/Hip Exercises: Aerobic   Nustep level 1 x 5 minutes   Knee/Hip Exercises: Standing   Forward Step Up Both;5 reps;Hand Hold:  1;Step Height: 2"   Other Standing Knee Exercises mini squats x 10   Knee/Hip Exercises: Seated   Long Arc Quad Both;10 reps  3 second hold   Marching Both;10 reps                  PT Short Term Goals - 01/16/15 1012    PT SHORT TERM GOAL #1   Title Pt will be independent in initial HEP   Time 4   Status On-going   PT SHORT TERM GOAL #2   Title Pt will improve BERG by 5 points to demo improved balance   Time 4   Period Weeks   Status On-going   PT SHORT TERM GOAL #3   Title Pt will tolerate walking x 5 minutes without rest breaks   Time 4   Period Weeks   Status On-going           PT Long Term Goals - 01/16/15 1012    PT LONG TERM GOAL #1   Title Pt will be independent in HEP   Time 8   Period Weeks   Status On-going   PT LONG TERM GOAL #2   Title Pt will improve BERG to 35/56 to demo improved balance   Time  8   Status On-going   PT LONG TERM GOAL #3   Title Pt will tolerate gait x 10 minutes without rest breaks   Time 8   Period Weeks   Status On-going               Plan - 01/16/15 1010    Clinical Impression Statement Pt reports she completed HEP. Pt continues with decreased mobility, balance and strength and will continue to benefit from skilled PT   Rehab Potential Good   PT Frequency 2x / week   PT Treatment/Interventions ADLs/Self Care Home Management;Cryotherapy;Electrical Stimulation;Moist Heat;Patient/family education;Taping;Energy conservation;Passive range of motion;Neuromuscular re-education;Balance training;Therapeutic exercise;Therapeutic activities;Functional mobility training;Stair training;Gait training;DME Instruction;Manual techniques   PT Next Visit Plan continue balance and gait activities   Consulted and Agree with Plan of Care Patient        Problem List Patient Active Problem List   Diagnosis Date Noted  . Hypertension   . Incontinence   . Multinodular goiter   . Depression   . CLAUDICATION 12/02/2009  .  CHEST PAIN 12/02/2009    Isabelle Course, PT, DPT  01/16/2015, 10:15 AM  [facility] [address] [place], [place], [address] Phone: [phone]   Fax:  [phone]  Name: Kerry Garcia MRN: [id number] Date of Birth: January 04, 1926

## 2015-01-17 ENCOUNTER — Ambulatory Visit (INDEPENDENT_AMBULATORY_CARE_PROVIDER_SITE_OTHER): Payer: Medicare Other | Admitting: Podiatry

## 2015-01-17 ENCOUNTER — Encounter: Payer: Self-pay | Admitting: Podiatry

## 2015-01-17 DIAGNOSIS — M79673 Pain in unspecified foot: Secondary | ICD-10-CM

## 2015-01-17 DIAGNOSIS — B351 Tinea unguium: Secondary | ICD-10-CM

## 2015-01-17 DIAGNOSIS — I739 Peripheral vascular disease, unspecified: Secondary | ICD-10-CM

## 2015-01-17 DIAGNOSIS — M79609 Pain in unspecified limb: Principal | ICD-10-CM

## 2015-01-17 MED ORDER — DESONIDE 0.05 % EX CREA: TOPICAL_CREAM | Freq: Two times a day (BID) | CUTANEOUS | Status: DC

## 2015-01-17 NOTE — Progress Notes (Signed)
Patient ID: Kerry Garcia, female   DOB: 01/31/1926, 81 y.o.   MRN: [id number] Complaint:  Visit Type: Patient returns to my office for continued preventative foot care services. Complaint: Patient states" my nails have grown long and thick and become painful to walk and wear shoes" . The patient presents for preventative foot care services. No changes to ROS  Podiatric Exam: Vascular: dorsalis pedis pulses are palpable bilateral. Her posterior tibial pulses are non palpable. . Capillary return is immediate. Temperature gradient is WNL. Skin turgor WNL  Sensorium: Normal Semmes Weinstein monofilament test. Normal tactile sensation bilaterally. Nail Exam: Pt has thick disfigured discolored nails with subungual debris noted bilateral entire nail hallux through fifth toenails Ulcer Exam: There is no evidence of ulcer or pre-ulcerative changes or infection. Orthopedic Exam: Muscle tone and strength are WNL. No limitations in general ROM. No crepitus or effusions noted. Foot type and digits show no abnormalities. Bony prominences are unremarkable. Skin: No Porokeratosis. No infection or ulcers  Diagnosis:  Onychomycosis, , Pain in right toe, pain in left toes  Treatment & Plan Procedures and Treatment: Consent by patient was obtained for treatment procedures. The patient understood the discussion of treatment and procedures well. All questions were answered thoroughly reviewed. Debridement of mycotic and hypertrophic toenails, 1 through 5 bilateral and clearing of subungual debris. No ulceration, no infection noted.  Return Visit-Office Procedure: Patient instructed to return to the office for a follow up visit 3 months for continued evaluation and treatment. 

## 2015-01-23 ENCOUNTER — Encounter (INDEPENDENT_AMBULATORY_CARE_PROVIDER_SITE_OTHER): Payer: Medicare Other | Admitting: Ophthalmology

## 2015-01-23 DIAGNOSIS — I10 Essential (primary) hypertension: Secondary | ICD-10-CM | POA: Diagnosis not present

## 2015-01-23 DIAGNOSIS — H43813 Vitreous degeneration, bilateral: Secondary | ICD-10-CM

## 2015-01-23 DIAGNOSIS — H35033 Hypertensive retinopathy, bilateral: Secondary | ICD-10-CM | POA: Diagnosis not present

## 2015-01-23 DIAGNOSIS — H34831 Tributary (branch) retinal vein occlusion, right eye, with macular edema: Secondary | ICD-10-CM

## 2015-01-24 ENCOUNTER — Ambulatory Visit: Payer: Medicare Other | Admitting: Physical Therapy

## 2015-01-24 ENCOUNTER — Encounter: Payer: Self-pay | Admitting: Physical Therapy

## 2015-01-24 DIAGNOSIS — R269 Unspecified abnormalities of gait and mobility: Secondary | ICD-10-CM | POA: Diagnosis not present

## 2015-01-24 DIAGNOSIS — R29898 Other symptoms and signs involving the musculoskeletal system: Secondary | ICD-10-CM | POA: Diagnosis not present

## 2015-01-24 NOTE — Therapy (Signed)
[facility] [address] [place], [place], [address] Phone: [phone]   Fax:  [phone]  Physical Therapy Treatment  Patient Details  Name: Kerry Garcia MRN: [id number] Date of Birth: [birth year] Referring Provider: Dr. [name]  Encounter Date: 01/24/2015      PT End of Session - 01/24/15 1023    Visit Number 3   Number of Visits 10  Medicare   Date for PT Re-Evaluation 03/11/15   PT Start Time 1020   PT Stop Time 1100   PT Time Calculation (min) 40 min   Activity Tolerance Patient tolerated treatment well   Behavior During Therapy [facility] for tasks assessed/performed      Past Medical History  Diagnosis Date  . Hypertension   . Incontinence   . Hypothyroidism   . Depression     Past Surgical History  Procedure Laterality Date  . Spine surgery    . Breast lumpectomy  1965    There were no vitals filed for this visit.  Visit Diagnosis:  Weakness of both lower extremities  Abnormality of gait      Subjective Assessment - 01/24/15 1024    Subjective I feel better than I did yesterday.  I had retna surgery yesterday.  3 days ago I fell forward into boxes when reaching overhead  in closet.   Pertinent History 12/30/14 pt had eye surgery due to broken blood vessel in eye, pt with h/o dizziness/vertigo   Limitations Standing;Lifting;Walking   How long can you stand comfortably? 30 minutes   How long can you walk comfortably? 2 minutes   Patient Stated Goals decrease pain, increase mobility   Currently in Pain? Yes   Pain Score 5    Pain Location Knee   Pain Orientation Left;Right   Pain Descriptors / Indicators Aching   Pain Type Chronic pain   Pain Onset More than a month ago   Pain Frequency Intermittent   Aggravating Factors  not sure   Pain Relieving Factors not sure   Multiple Pain Sites No            [facility] PT Assessment - 01/24/15 0001    Assessment   Medical  Diagnosis gait difficulty   Referring Provider Dr. [name]   Precautions   Precautions None   Balance Screen   Has the patient fallen in the past 6 months Yes   How many times? 1  last 3 days fell forward into boxes   Has the patient had a decrease in activity level because of a fear of falling?  No   Is the patient reluctant to leave their home because of a fear of falling?  No                     [facility] Adult PT Treatment/Exercise - 01/24/15 0001    Self-Care   Self-Care Other Self-Care Comments  tips to avoid falls   Knee/Hip Exercises: Stretches   Active Hamstring Stretch Both;2 reps;30 seconds  seated   Piriformis Stretch Both;2 reps;30 seconds  seated   Knee/Hip Exercises: Aerobic   Nustep level 1 x 8 minutes   Knee/Hip Exercises: Standing   Other Standing Knee Exercises sit to stand from chair with arms 5x   Knee/Hip Exercises: Seated   Long Arc Quad Both;10 reps  3 second hold   Marching Both;10 reps                PT  Education - 01/24/15 1110    Education provided Yes   Education Details tips to avoid falls   Person(s) Educated Patient   Methods Explanation;Handout   Comprehension Verbalized understanding          PT Short Term Goals - 01/24/15 1111    PT SHORT TERM GOAL #1   Title Pt will be independent in initial HEP   Time 4   Period Weeks   Status On-going   PT SHORT TERM GOAL #2   Title Pt will improve BERG by 5 points to demo improved balance   Time 4   Period Weeks   Status On-going   PT SHORT TERM GOAL #3   Title Pt will tolerate walking x 5 minutes without rest breaks   Time 4   Period Weeks   Status On-going           PT Long Term Goals - 01/16/15 1012    PT LONG TERM GOAL #1   Title Pt will be independent in HEP   Time 8   Period Weeks   Status On-going   PT LONG TERM GOAL #2   Title Pt will improve BERG to 35/56 to demo improved balance   Time 8   Status On-going   PT LONG TERM GOAL #3   Title  Pt will tolerate gait x 10 minutes without rest breaks   Time 8   Period Weeks   Status On-going               Problem List Patient Active Problem List   Diagnosis Date Noted  . Hypertension   . Incontinence   . Multinodular goiter   . Depression   . CLAUDICATION 12/02/2009  . CHEST PAIN 12/02/2009    GRAY,CHERYL,PT 01/24/2015, 11:14 AM  [facility] [address], [place], [place], [address] Phone: [phone]   Fax:  [phone]  Name: Kerry Garcia MRN: [id number] Date of Birth: [birth year]

## 2015-01-24 NOTE — Patient Instructions (Signed)

## 2015-01-27 ENCOUNTER — Ambulatory Visit: Payer: Medicare Other | Admitting: Physical Therapy

## 2015-01-27 ENCOUNTER — Encounter: Payer: Self-pay | Admitting: Physical Therapy

## 2015-01-27 DIAGNOSIS — R29898 Other symptoms and signs involving the musculoskeletal system: Secondary | ICD-10-CM | POA: Diagnosis not present

## 2015-01-27 DIAGNOSIS — R269 Unspecified abnormalities of gait and mobility: Secondary | ICD-10-CM | POA: Diagnosis not present

## 2015-01-27 NOTE — Therapy (Signed)
[facility] [address], [place], [place], [address] Phone: [phone]   Fax:  [phone]  Physical Therapy Treatment  Patient Details  Name: Kerry Garcia MRN: [id number] Date of Birth: 1925-11-15 Referring Provider: Dr. [name]  Encounter Date: 01/27/2015      PT End of Session - 01/27/15 1024    Visit Number 4   Number of Visits 10   Date for PT Re-Evaluation 03/11/15   PT Start Time [id number]   PT Stop Time 1055   PT Time Calculation (min) 41 min   Activity Tolerance Patient tolerated treatment well   Behavior During Therapy [facility] for tasks assessed/performed      Past Medical History  Diagnosis Date  . Hypertension   . Incontinence   . Hypothyroidism   . Depression     Past Surgical History  Procedure Laterality Date  . Spine surgery    . Breast lumpectomy  1965    There were no vitals filed for this visit.  Visit Diagnosis:  Weakness of both lower extremities  Abnormality of gait      Subjective Assessment - 01/27/15 1022    Subjective I'm feeling pretty good today. I drove myself today.    Currently in Pain? Yes   Pain Score 5    Pain Location Knee   Pain Orientation Left   Pain Descriptors / Indicators Sore   Aggravating Factors  nothing specific   Pain Relieving Factors Prayer, nutritional foods   Multiple Pain Sites No                         [facility] Adult PT Treatment/Exercise - 01/27/15 0001    Ambulation/Gait   Ambulation Distance (Feet) 240 Feet   Assistive device Straight cane   Ambulation Surface Level;Indoor   Knee/Hip Exercises: [employer] operational officer Both;2 reps;30 seconds  seated   Piriformis Stretch Both;2 reps;30 seconds  seated   Knee/Hip Exercises: Aerobic   Nustep L1 x 8 min   Knee/Hip Exercises: Seated   Long Arc Quad Strengthening;Both;2 sets;10 reps  3 sec hold   Ball Squeeze 10x   Sit to [employer] 5 reps;with UE  support  VC to use legs more                  PT Short Term Goals - 01/27/15 1039    PT SHORT TERM GOAL #1   Title Pt will be independent in initial HEP   Time 4   Period Weeks   Status Achieved   PT SHORT TERM GOAL #3   Title Pt will tolerate walking x 5 minutes without rest breaks   Time 4   Period Weeks   Status Achieved  5 min           PT Long Term Goals - 01/16/15 1012    PT LONG TERM GOAL #1   Title Pt will be independent in HEP   Time 8   Period Weeks   Status On-going   PT LONG TERM GOAL #2   Title Pt will improve BERG to 35/56 to demo improved balance   Time 8   Status On-going   PT LONG TERM GOAL #3   Title Pt will tolerate gait x 10 minutes without rest breaks   Time 8   Period Weeks   Status On-going  Plan - 01/27/15 1054    Clinical Impression Statement Pt walked 240 feet today without resting and reports she can walk about 5 min without breaks, meeting STG. She was also able to use more LE vs UE in her sit to stand today.    Pt will benefit from skilled therapeutic intervention in order to improve on the following deficits Abnormal gait;Decreased strength;Difficulty walking;Decreased mobility;Decreased range of motion;Decreased balance;Decreased endurance;Pain;Impaired flexibility;Increased muscle spasms   Rehab Potential Good   PT Frequency 2x / week   PT Duration 8 weeks   PT Treatment/Interventions ADLs/Self Care Home Management;Cryotherapy;Electrical Stimulation;Moist Heat;Patient/family education;Taping;Energy conservation;Passive range of motion;Neuromuscular re-education;Balance training;Therapeutic exercise;Therapeutic activities;Functional mobility training;Stair training;Gait training;DME Instruction;Manual techniques   PT Home Exercise Plan seated strength and stretching   Consulted and Agree with Plan of Care Patient        Problem List Patient Active Problem List   Diagnosis Date Noted  . Hypertension    . Incontinence   . Multinodular goiter   . Depression   . CLAUDICATION 12/02/2009  . CHEST PAIN 12/02/2009    Drew Lips, PTA 01/27/2015, 10:56 AM  [facility] [address], [place], [place], [address] Phone: [phone]   Fax:  [phone]  Name: SANDRAL STEINBRECHER MRN: [id number] Date of Birth: 1925-12-15

## 2015-01-30 ENCOUNTER — Ambulatory Visit: Payer: Medicare Other | Admitting: Physical Therapy

## 2015-01-30 DIAGNOSIS — R269 Unspecified abnormalities of gait and mobility: Secondary | ICD-10-CM

## 2015-01-30 DIAGNOSIS — R29898 Other symptoms and signs involving the musculoskeletal system: Secondary | ICD-10-CM | POA: Diagnosis not present

## 2015-01-30 NOTE — Therapy (Signed)
[facility] [address], [place], [place], [address] Phone: [phone]   Fax:  [phone]  Physical Therapy Treatment  Patient Details  Name: Kerry Garcia MRN: [id number] Date of Birth: 1925-04-07 Referring Provider: Dr. [name]  Encounter Date: 01/30/2015      PT End of Session - 01/30/15 1009    Visit Number 5   Number of Visits 10   Date for PT Re-Evaluation 03/11/15   PT Start Time 0925   PT Stop Time 1010   PT Time Calculation (min) 45 min   Activity Tolerance Patient tolerated treatment well   Behavior During Therapy [facility] for tasks assessed/performed      Past Medical History  Diagnosis Date  . Hypertension   . Incontinence   . Hypothyroidism   . Depression     Past Surgical History  Procedure Laterality Date  . Spine surgery    . Breast lumpectomy  1965    There were no vitals filed for this visit.  Visit Diagnosis:  Weakness of both lower extremities  Abnormality of gait      Subjective Assessment - 01/30/15 0929    Subjective I had a bad day yesterday, I was burning and sore. I am much better today. I think it was something I ate.   Currently in Pain? Yes   Pain Score 3    Pain Location Knee   Pain Orientation Left   Pain Descriptors / Indicators Sore   Pain Type Chronic pain   Pain Onset More than a month ago                         [facility] Adult PT Treatment/Exercise - 01/30/15 0001    Balance   Balance comment standing feet together x 30 sec, standing small step forward bilat x 30 sec, standing on foam 3 x 60 seconds, initially min A, progressed to close supervision   Knee/Hip Exercises: Aerobic   Nustep L1 x 6 mins   Knee/Hip Exercises: Standing   Hip Abduction Stengthening;Both;10 reps   Forward Step Up Both;10 reps;Hand Hold: 2;Step Height: 2"  then 4'' step x 5 bilat   Other Standing Knee Exercises HS curls x 10 bilat   Knee/Hip Exercises:  Seated   Long Arc Quad Strengthening;Both;10 reps  5 sec hold   Sit to [employer] 5 reps;with UE support                PT Education - 01/30/15 1008    Education provided Yes   Education Details standing therex HEP   Person(s) Educated Patient   Methods Explanation;Demonstration;Handout   Comprehension Returned demonstration;Verbalized understanding          PT Short Term Goals - 01/30/15 1010    PT SHORT TERM GOAL #1   Title Pt will be independent in initial HEP   Status Achieved   PT SHORT TERM GOAL #2   Title Pt will improve BERG by 5 points to demo improved balance   Status On-going   PT SHORT TERM GOAL #3   Title Pt will tolerate walking x 5 minutes without rest breaks   Status Achieved           PT Long Term Goals - 01/30/15 1010    PT LONG TERM GOAL #1   Title Pt will be independent in HEP   Time 8   Period Weeks   Status On-going   PT  LONG TERM GOAL #2   Title Pt will improve BERG to 35/56 to demo improved balance   Time 8   Period Weeks   Status On-going   PT LONG TERM GOAL #3   Title Pt will tolerate gait x 10 minutes without rest breaks   Time 8   Period Weeks   Status On-going               Plan - 01/30/15 1009    Clinical Impression Statement Pt improving strength. Needs continued work on balance.   Pt will benefit from skilled therapeutic intervention in order to improve on the following deficits Abnormal gait;Decreased strength;Difficulty walking;Decreased mobility;Decreased range of motion;Decreased balance;Decreased endurance;Pain;Impaired flexibility;Increased muscle spasms   PT Frequency 2x / week   PT Duration 8 weeks   PT Treatment/Interventions ADLs/Self Care Home Management;Cryotherapy;Electrical Stimulation;Moist Heat;Patient/family education;Taping;Energy conservation;Passive range of motion;Neuromuscular re-education;Balance training;Therapeutic exercise;Therapeutic activities;Functional mobility training;Stair training;Gait  training;DME Instruction;Manual techniques   PT Next Visit Plan progress balance and gait   PT Home Exercise Plan standing strengthening   Consulted and Agree with Plan of Care Patient        Problem List Patient Active Problem List   Diagnosis Date Noted  . Hypertension   . Incontinence   . Multinodular goiter   . Depression   . CLAUDICATION 12/02/2009  . CHEST PAIN 12/02/2009    Isabelle Course, PT, DPT  01/30/2015, 10:11 AM  [facility] [address], [place], [place], [address] Phone: [phone]   Fax:  [phone]  Name: Kerry Garcia MRN: [id number] Date of Birth: December 12, 1925

## 2015-01-30 NOTE — Patient Instructions (Signed)
ABDUCTION: Standing - Resistance Band (Active)   Stand, feet flat.lift right leg out to side. Complete ___ sets of ___ repetitions. Perform ___ sessions per day.   Strengthening: Hip Extension - Resisted   Standing and pull leg straight back. Repeat ____ times per set. Do ____ sets per session. Do ____ sessions per day  [facility] [address] [place], [place] [address] Phone # [phone] Fax [phone]

## 2015-02-03 ENCOUNTER — Ambulatory Visit: Payer: Medicare Other | Admitting: Physical Therapy

## 2015-02-03 ENCOUNTER — Encounter: Payer: Self-pay | Admitting: Physical Therapy

## 2015-02-03 DIAGNOSIS — R269 Unspecified abnormalities of gait and mobility: Secondary | ICD-10-CM

## 2015-02-03 DIAGNOSIS — R29898 Other symptoms and signs involving the musculoskeletal system: Secondary | ICD-10-CM | POA: Diagnosis not present

## 2015-02-03 NOTE — Therapy (Signed)
[facility] [address] [place], [place], [address] Phone: [phone]   Fax:  [phone]  Physical Therapy Treatment  Patient Details  Name: Kerry Garcia MRN: [id number] Date of Birth: [birth year] Referring Provider: Dr. [name]  Encounter Date: 02/03/2015      PT End of Session - 02/03/15 1020    Visit Number 6   Number of Visits 10   Date for PT Re-Evaluation 03/11/15   PT Start Time 1012   PT Stop Time 1059   PT Time Calculation (min) 47 min   Activity Tolerance Patient tolerated treatment well   Behavior During Therapy [facility] for tasks assessed/performed      Past Medical History  Diagnosis Date  . Hypertension   . Incontinence   . Hypothyroidism   . Depression     Past Surgical History  Procedure Laterality Date  . Spine surgery    . Breast lumpectomy  1965    There were no vitals filed for this visit.  Visit Diagnosis:  Weakness of both lower extremities  Abnormality of gait      Subjective Assessment - 02/03/15 1017    Subjective I was hurting all over all weeknend. Had to take pain medications.    Currently in Pain? Yes   Pain Score 6    Pain Location Leg   Pain Orientation Left  Right a little but LT mainly.    Aggravating Factors  Constant as of late   Pain Relieving Factors medication   Multiple Pain Sites No                         [facility] Adult PT Treatment/Exercise - 02/03/15 0001    Ambulation/Gait   Ambulation Distance (Feet) --  240 without rest, then 80 1x with cane and SBA: 3 min total.   High Level Balance   High Level Balance Activities Side stepping;Marching forwards   Knee/Hip Exercises: Aerobic   Nustep L1 x 7 min   Knee/Hip Exercises: Seated   Ball Squeeze 20x   Abduction/Adduction  --  yellow band hip abduction 3x10   Sit to Sand 2 sets;5 reps;with UE support  Ankle rocker board x 44mn                  PT Short  Term Goals - 02/03/15 1020    PT SHORT TERM GOAL #2   Title Pt will improve BERG by 5 points to demo improved balance   Time 4   Period Weeks   Status On-going  Held from testing due to pain today.            PT Long Term Goals - 01/30/15 1010    PT LONG TERM GOAL #1   Title Pt will be independent in HEP   Time 8   Period Weeks   Status On-going   PT LONG TERM GOAL #2   Title Pt will improve BERG to 35/56 to demo improved balance   Time 8   Period Weeks   Status On-going   PT LONG TERM GOAL #3   Title Pt will tolerate gait x 10 minutes without rest breaks   Time 8   Period Weeks   Status On-going               Plan - 02/03/15 1044    Clinical Impression Statement Pt having  more pain throughout her body as of late. She  does report when she lays down at night her legs do not hurt her as much. You can tell she is guarded with her movement today in her LT leg due to pain. No new goals met today due to pain.    Pt will benefit from skilled therapeutic intervention in order to improve on the following deficits Abnormal gait;Decreased strength;Difficulty walking;Decreased mobility;Decreased range of motion;Decreased balance;Decreased endurance;Pain;Impaired flexibility;Increased muscle spasms   Rehab Potential Good   PT Frequency 2x / week   PT Duration 8 weeks   PT Treatment/Interventions ADLs/Self Care Home Management;Cryotherapy;Electrical Stimulation;Moist Heat;Patient/family education;Taping;Energy conservation;Passive range of motion;Neuromuscular re-education;Balance training;Therapeutic exercise;Therapeutic activities;Functional mobility training;Stair training;Gait training;DME Instruction;Manual techniques   PT Next Visit Plan progress balance and gait   Consulted and Agree with Plan of Care Patient        Problem List Patient Active Problem List   Diagnosis Date Noted  . Hypertension   . Incontinence   . Multinodular goiter   . Depression   .  CLAUDICATION 12/02/2009  . CHEST PAIN 12/02/2009    Ahmiya Abee, PTA 02/03/2015, 11:00 AM  [facility] [address] [place], [place], [address] Phone: [phone]   Fax:  [phone]  Name: FIA HEBERT MRN: [id number] Date of Birth: February 26, 1926

## 2015-02-10 DIAGNOSIS — H40051 Ocular hypertension, right eye: Secondary | ICD-10-CM | POA: Diagnosis not present

## 2015-02-11 ENCOUNTER — Ambulatory Visit: Payer: Medicare Other | Admitting: Physical Therapy

## 2015-02-11 DIAGNOSIS — R269 Unspecified abnormalities of gait and mobility: Secondary | ICD-10-CM | POA: Diagnosis not present

## 2015-02-11 DIAGNOSIS — R29898 Other symptoms and signs involving the musculoskeletal system: Secondary | ICD-10-CM | POA: Diagnosis not present

## 2015-02-11 NOTE — Therapy (Signed)
[facility] [address], [place], [place], [address] Phone: [phone]   Fax:  [phone]  Physical Therapy Treatment  Patient Details  Name: Kerry Garcia MRN: [id number] Date of Birth: [birth year] Referring Provider: Dr. [name]  Encounter Date: 02/11/2015      PT End of Session - 02/11/15 0923    Visit Number 7   Number of Visits 10   Date for PT Re-Evaluation 03/11/15   PT Start Time 0841   PT Stop Time 0923   PT Time Calculation (min) 42 min   Activity Tolerance Patient tolerated treatment well   Behavior During Therapy [facility] for tasks assessed/performed      Past Medical History  Diagnosis Date  . Hypertension   . Incontinence   . Hypothyroidism   . Depression     Past Surgical History  Procedure Laterality Date  . Spine surgery    . Breast lumpectomy  1965    There were no vitals filed for this visit.  Visit Diagnosis:  Weakness of both lower extremities  Abnormality of gait      Subjective Assessment - 02/11/15 0847    Subjective I feel so much better than last week   Pertinent History 12/30/14 pt had eye surgery due to broken blood vessel in eye, pt with h/o dizziness/vertigo   Currently in Pain? Yes   Pain Score 3    Pain Location Knee   Pain Orientation Left   Pain Descriptors / Indicators Tender                         [facility] Adult PT Treatment/Exercise - 02/11/15 0001    Ambulation/Gait   Ambulation Distance (Feet) 240 Feet  without SPC, about 3 minutes   Berg Balance Test   Sit to Stand Able to stand  independently using hands   Standing Unsupported Able to stand safely 2 minutes   Sitting with Back Unsupported but Feet Supported on Floor or Stool Able to sit safely and securely 2 minutes   Stand to Sit Controls descent by using hands   Transfers Able to transfer safely, definite need of hands   Standing Unsupported with Eyes Closed Able to  stand 10 seconds with supervision   Standing Ubsupported with Feet Together Needs help to attain position but able to stand for 30 seconds with feet together   From Standing, Reach Forward with Outstretched Arm Can reach forward >5 cm safely (2")   From Standing Position, Pick up Object from Floor Able to pick up shoe, needs supervision   From Standing Position, Turn to Look Behind Over each Shoulder Turn sideways only but maintains balance   Turn 360 Degrees Able to turn 360 degrees safely one side only in 4 seconds or less   Standing Unsupported, Alternately Place Feet on Step/Stool Able to complete >2 steps/needs minimal assist   Standing Unsupported, One Foot in Front Needs help to step but can hold 15 seconds   Standing on One Leg Unable to try or needs assist to prevent fall   Total Score 33   High Level Balance   High Level Balance Activities Side stepping   Knee/Hip Exercises: Aerobic   Nustep L 1 x 6 mins   Knee/Hip Exercises: Seated   Long Arc Quad Strengthening;Both;10 reps  5 sec hold   Ball Squeeze 20x   Other Seated Knee/Hip Exercises ankle PF/DF x 30   Abduction/Adduction  Both;20  reps  with yellow t band                  PT Short Term Goals - 02/11/15 0902    PT SHORT TERM GOAL #1   Title Pt will be independent in initial HEP   Status Achieved   PT SHORT TERM GOAL #2   Title Pt will improve BERG by 5 points to demo improved balance   Status Achieved  33/56 on 11/29   PT SHORT TERM GOAL #3   Title Pt will tolerate walking x 5 minutes without rest breaks   Status Achieved           PT Long Term Goals - 02/11/15 0924    PT LONG TERM GOAL #1   Title Pt will be independent in HEP   Time 8   Period Weeks   Status On-going   PT LONG TERM GOAL #2   Title Pt will improve BERG to 35/56 to demo improved balance   Time 8   Period Weeks   Status On-going   PT LONG TERM GOAL #3   Title Pt will tolerate gait x 10 minutes without rest breaks   Time 8    Period Weeks   Status On-going               Plan - 02/11/15 [id number]    Clinical Impression Statement Pt feeling much better today.  Improved BERG balance score from 24/56 on eval to 33/56 today.  Much improved confidence during gait without SPC   Pt will benefit from skilled therapeutic intervention in order to improve on the following deficits Abnormal gait;Decreased strength;Difficulty walking;Decreased mobility;Decreased range of motion;Decreased balance;Decreased endurance;Pain;Impaired flexibility;Increased muscle spasms   PT Frequency 2x / week   PT Duration 8 weeks   PT Treatment/Interventions ADLs/Self Care Home Management;Cryotherapy;Electrical Stimulation;Moist Heat;Patient/family education;Taping;Energy conservation;Passive range of motion;Neuromuscular re-education;Balance training;Therapeutic exercise;Therapeutic activities;Functional mobility training;Stair training;Gait training;DME Instruction;Manual techniques   PT Next Visit Plan progress balance and gait without AD, HEP for balance   Consulted and Agree with Plan of Care Patient        Problem List Patient Active Problem List   Diagnosis Date Noted  . Hypertension   . Incontinence   . Multinodular goiter   . Depression   . CLAUDICATION 12/02/2009  . CHEST PAIN 12/02/2009    Isabelle Course, PT, DPT  02/11/2015, 9:26 AM  [facility] [address] [place], [place], [address] Phone: [phone]   Fax:  [phone]  Name: Kerry Garcia MRN: [id number] Date of Birth: November 06, 1925

## 2015-02-13 ENCOUNTER — Ambulatory Visit: Payer: Medicare Other | Attending: Internal Medicine | Admitting: Physical Therapy

## 2015-02-13 DIAGNOSIS — R269 Unspecified abnormalities of gait and mobility: Secondary | ICD-10-CM | POA: Insufficient documentation

## 2015-02-13 DIAGNOSIS — R29898 Other symptoms and signs involving the musculoskeletal system: Secondary | ICD-10-CM | POA: Insufficient documentation

## 2015-02-13 NOTE — Patient Instructions (Signed)
ABDUCTION: Standing - Resistance Band (Active)   Stand, feet flat. Against yellow resistance band, lift right leg out to side. Complete ___ sets of ___ repetitions. Perform ___ sessions per day.   Strengthening: Hip Extension - Resisted   With tubing around right ankle, face anchor and pull leg straight back. Repeat ____ times per set. Do ____ sets per session. Do ____ sessions per day.   Mini Squat: Double Leg   With feet shoulder width apart, reach forward for balance and do a mini squat. Keep knees in line with second toe. Knees do not go past toes. Repeat ___ times per set. Rest ___ seconds after set. Do ___ sets per session.   Functional Quadriceps: Sit to Stand   Sit on edge of chair, feet flat on floor. Stand upright, extending knees fully. Repeat ____ times per set. Do ____ sets per session. Do ____ sessions per day.   KNEE: Extension, Long Arc Quads - Sitting   Raise leg until knee is straight. ___ reps per set, ___ sets per day, ___ days per week  FLEXION: Sitting (Active)   Sit, both feet flat. Lift right knee toward ceiling. Use ___ lbs. Complete ___ sets of ___ repetitions. Perform ___ sessions per day.  Copyright  VHI. All rights reserved.    [facility] [address], [place], [place] [address] Phone # [phone] Fax [phone]

## 2015-02-13 NOTE — Therapy (Signed)
[facility] [address] [place], [place], [address] Phone: [phone]   Fax:  [phone]  Physical Therapy Treatment  Patient Details  Name: Kerry Garcia MRN: [id number] Date of Birth: 09/02/1925 Referring Provider: Dr. [name]  Encounter Date: 02/13/2015      PT End of Session - 02/13/15 0923    Visit Number 8   Number of Visits 10   Date for PT Re-Evaluation 03/11/15   PT Start Time 0844   PT Stop Time 0925   PT Time Calculation (min) 41 min   Activity Tolerance Patient tolerated treatment well   Behavior During Therapy [facility] for tasks assessed/performed      Past Medical History  Diagnosis Date  . Hypertension   . Incontinence   . Hypothyroidism   . Depression     Past Surgical History  Procedure Laterality Date  . Spine surgery    . Breast lumpectomy  1965    There were no vitals filed for this visit.  Visit Diagnosis:  Weakness of both lower extremities  Abnormality of gait      Subjective Assessment - 02/13/15 0844    Subjective I feel good   Pertinent History 12/30/14 pt had eye surgery due to broken blood vessel in eye, pt with h/o dizziness/vertigo   Currently in Pain? No/denies                         [facility] Adult PT Treatment/Exercise - 02/13/15 0001    Ambulation/Gait   Ambulation Distance (Feet) 240 Feet  without SPC, about 3 minutes   High Level Balance   High Level Balance Activities --  wt shifts semi tandem and laterally on trampoline 1 min each   High Level Balance Comments standing on foam 90 sec with min A, then 30 sec with min A   Knee/Hip Exercises: Aerobic   Nustep L2 x 6 minutes   Knee/Hip Exercises: Standing   Hip Abduction Stengthening;Both;10 reps   Hip Extension Stengthening;Both;10 reps   Other Standing Knee Exercises mini squat x 10   Other Standing Knee Exercises sit to stand x 10   Knee/Hip Exercises: Seated   Long Arc  Quad Strengthening;Both;10 reps  5 sec hold   Marching Strengthening;Both;10 reps                PT Education - 02/13/15 [phone]    Education provided Yes   Education Details HEP   Person(s) Educated Patient   Methods Explanation;Demonstration;Handout   Comprehension Returned demonstration;Verbalized understanding          PT Short Term Goals - 02/13/15 0924    PT SHORT TERM GOAL #1   Title Pt will be independent in initial HEP   Status Achieved   PT SHORT TERM GOAL #2   Title Pt will improve BERG by 5 points to demo improved balance   Status Achieved   PT SHORT TERM GOAL #3   Title Pt will tolerate walking x 5 minutes without rest breaks   Status Achieved           PT Long Term Goals - 02/13/15 0925    PT LONG TERM GOAL #1   Title Pt will be independent in HEP   Time 8   Period Weeks   Status On-going   PT LONG TERM GOAL #2   Title Pt will improve BERG to 35/56 to demo improved balance   Time 8  Period Weeks   Status On-going   PT LONG TERM GOAL #3   Title Pt will tolerate gait x 10 minutes without rest breaks   Time 8   Period Weeks   Status On-going               Plan - 02/13/15 [id number]    Clinical Impression Statement Pt felt a little dizzy with standing on foam, did better with trampoline for balance challenges.  Good understanding of HEP in the clinic.   Pt will benefit from skilled therapeutic intervention in order to improve on the following deficits Abnormal gait;Decreased strength;Difficulty walking;Decreased mobility;Decreased range of motion;Decreased balance;Decreased endurance;Pain;Impaired flexibility;Increased muscle spasms   PT Frequency 2x / week   PT Duration 8 weeks   PT Treatment/Interventions ADLs/Self Care Home Management;Cryotherapy;Electrical Stimulation;Moist Heat;Patient/family education;Taping;Energy conservation;Passive range of motion;Neuromuscular re-education;Balance training;Therapeutic exercise;Therapeutic  activities;Functional mobility training;Stair training;Gait training;DME Instruction;Manual techniques   PT Next Visit Plan progress balance, check pt understanding of HEP   Consulted and Agree with Plan of Care Patient        Problem List Patient Active Problem List   Diagnosis Date Noted  . Hypertension   . Incontinence   . Multinodular goiter   . Depression   . CLAUDICATION 12/02/2009  . CHEST PAIN 12/02/2009    Isabelle Course, PT, DPT  02/13/2015, 9:26 AM  [facility] [address] [place], [place], [address] Phone: [phone]   Fax:  [phone]  Name: Kerry Garcia MRN: [id number] Date of Birth: 08-08-1925

## 2015-02-19 ENCOUNTER — Ambulatory Visit: Payer: Medicare Other | Admitting: Physical Therapy

## 2015-02-19 ENCOUNTER — Encounter: Payer: Self-pay | Admitting: Physical Therapy

## 2015-02-19 DIAGNOSIS — R269 Unspecified abnormalities of gait and mobility: Secondary | ICD-10-CM | POA: Diagnosis not present

## 2015-02-19 DIAGNOSIS — R29898 Other symptoms and signs involving the musculoskeletal system: Secondary | ICD-10-CM | POA: Diagnosis not present

## 2015-02-19 NOTE — Therapy (Signed)
[facility] [address] [place], [place], [address] Phone: [phone]   Fax:  [phone]  Physical Therapy Treatment  Patient Details  Name: Kerry Garcia MRN: [id number] Date of Birth: April 01, 1925 Referring Provider: Dr. [name]  Encounter Date: 02/19/2015      PT End of Session - 02/19/15 0851    Visit Number 9   Number of Visits 10   Date for PT Re-Evaluation 03/11/15   PT Start Time 0843   PT Stop Time 0935   PT Time Calculation (min) 52 min   Activity Tolerance Patient limited by pain   Behavior During Therapy [facility] for tasks assessed/performed      Past Medical History  Diagnosis Date  . Hypertension   . Incontinence   . Hypothyroidism   . Depression     Past Surgical History  Procedure Laterality Date  . Spine surgery    . Breast lumpectomy  1965    There were no vitals filed for this visit.  Visit Diagnosis:  Weakness of both lower extremities  Abnormality of gait      Subjective Assessment - 02/19/15 0848    Subjective Pt reports being in a ton of pain throughout the LT leg. Ambulating very slow and cautious, limps off that leg secondary to pain.    Currently in Pain? Yes   Pain Score 8    Pain Location Leg   Pain Orientation Left   Pain Descriptors / Indicators Sharp   Aggravating Factors  Constant   Pain Relieving Factors Nothing today   Multiple Pain Sites No                         [facility] Adult PT Treatment/Exercise - 02/19/15 0001    Knee/Hip Exercises: Aerobic   Nustep L1 x 7 min   Moist Heat Therapy   Number Minutes Moist Heat 20 Minutes   Moist Heat Location Knee   Electrical Stimulation   Electrical Stimulation Location Lt Knee   Electrical Stimulation Action IFC   Electrical Stimulation Parameters 80-150 HZ   Electrical Stimulation Goals Pain                  PT Short Term Goals - 02/13/15 0924    PT SHORT TERM GOAL #1   Title Pt will be independent in initial HEP   Status Achieved   PT SHORT TERM GOAL #2   Title Pt will improve BERG by 5 points to demo improved balance   Status Achieved   PT SHORT TERM GOAL #3   Title Pt will tolerate walking x 5 minutes without rest breaks   Status Achieved           PT Long Term Goals - 02/19/15 0850    PT LONG TERM GOAL #1   Title Pt will be independent in HEP   Time 8   Period Weeks   Status On-going   PT LONG TERM GOAL #2   Title Pt will improve BERG to 35/56 to demo improved balance   Time 8   Period Weeks   Status On-going  Will not do today due to pain.   PT LONG TERM GOAL #3   Title Pt will tolerate gait x 10 minutes without rest breaks   Time 8   Period Weeks   Status On-going  Only vewry short distances to kitchen or bathroom this week due to pain.  Plan - 02/19/15 0917    Clinical Impression Statement Pt presented with significant knee pain today. She cannot bend her knee when walking or else she gets a very sharp pain. Able to tolerate the Nustep today wihtout increaseing pain, but treatment mainly focused on reducing her pain via modalities. She was encouraged to speak with her MD about pain meds.    Pt will benefit from skilled therapeutic intervention in order to improve on the following deficits Abnormal gait;Decreased strength;Difficulty walking;Decreased mobility;Decreased range of motion;Decreased balance;Decreased endurance;Pain;Impaired flexibility;Increased muscle spasms   Rehab Potential Good   PT Frequency 2x / week   PT Duration 8 weeks   PT Treatment/Interventions ADLs/Self Care Home Management;Cryotherapy;Electrical Stimulation;Moist Heat;Patient/family education;Taping;Energy conservation;Passive range of motion;Neuromuscular re-education;Balance training;Therapeutic exercise;Therapeutic activities;Functional mobility training;Stair training;Gait training;DME Instruction;Manual techniques   PT Next Visit  Plan Asses pain, FOTO, BERG if pain doesn't interfere too much.   Consulted and Agree with Plan of Care Patient        Problem List Patient Active Problem List   Diagnosis Date Noted  . Hypertension   . Incontinence   . Multinodular goiter   . Depression   . CLAUDICATION 12/02/2009  . CHEST PAIN 12/02/2009    COCHRAN,JENNIFER, PTA 02/19/2015, 9:20 AM  [facility] [address], [place], [place], [address] Phone: [phone]   Fax:  [phone]  Name: Kerry Garcia MRN: [id number] Date of Birth: 1925-08-17   Applied biofreeze to LT knee.

## 2015-02-19 NOTE — Therapy (Signed)
[facility] [address] [place], [place], [address] Phone: [phone]   Fax:  [phone]  Physical Therapy Treatment  Patient Details  Name: Kerry Garcia MRN: [id number] Date of Birth: 07-Nov-1925 Referring Provider: Dr. [name]  Encounter Date: 02/19/2015      PT End of Session - 02/19/15 0851    Visit Number 9   Number of Visits 10   Date for PT Re-Evaluation 03/11/15   PT Start Time 0843   PT Stop Time 0935   PT Time Calculation (min) 52 min   Activity Tolerance Patient limited by pain   Behavior During Therapy [facility] for tasks assessed/performed      Past Medical History  Diagnosis Date  . Hypertension   . Incontinence   . Hypothyroidism   . Depression     Past Surgical History  Procedure Laterality Date  . Spine surgery    . Breast lumpectomy  1965    There were no vitals filed for this visit.  Visit Diagnosis:  Weakness of both lower extremities  Abnormality of gait      Subjective Assessment - 02/19/15 0848    Subjective Pt reports being in a ton of pain throughout the LT leg. Ambulating very slow and cautious, limps off that leg secondary to pain.    Currently in Pain? Yes   Pain Score 8    Pain Location Leg   Pain Orientation Left   Pain Descriptors / Indicators Sharp   Aggravating Factors  Constant   Pain Relieving Factors Nothing today   Multiple Pain Sites No                         [facility] Adult PT Treatment/Exercise - 02/19/15 0001    Knee/Hip Exercises: Aerobic   Nustep L1 x 7 min   Knee/Hip Exercises: Seated   Ball Squeeze 2x10   Moist Heat Therapy   Number Minutes Moist Heat 20 Minutes   Moist Heat Location Knee   Electrical Stimulation   Electrical Stimulation Location Lt Knee   Electrical Stimulation Action IFC   Electrical Stimulation Parameters 80-150 HZ   Electrical Stimulation Goals Pain                  PT Short Term  Goals - 02/13/15 0924    PT SHORT TERM GOAL #1   Title Pt will be independent in initial HEP   Status Achieved   PT SHORT TERM GOAL #2   Title Pt will improve BERG by 5 points to demo improved balance   Status Achieved   PT SHORT TERM GOAL #3   Title Pt will tolerate walking x 5 minutes without rest breaks   Status Achieved           PT Long Term Goals - 02/19/15 0850    PT LONG TERM GOAL #1   Title Pt will be independent in HEP   Time 8   Period Weeks   Status On-going   PT LONG TERM GOAL #2   Title Pt will improve BERG to 35/56 to demo improved balance   Time 8   Period Weeks   Status On-going  Will not do today due to pain.   PT LONG TERM GOAL #3   Title Pt will tolerate gait x 10 minutes without rest breaks   Time 8   Period Weeks   Status On-going  Only vewry short distances to  kitchen or bathroom this week due to pain.                Plan - 02/19/15 0917    Clinical Impression Statement Pt presented with significant knee pain today. She cannot bend her knee when walking or else she gets a very sharp pain. Able to tolerate the Nustep today wihtout increaseing pain, but treatment mainly focused on reducing her pain via modalities. She was encouraged to speak with her MD about pain meds.    Pt will benefit from skilled therapeutic intervention in order to improve on the following deficits Abnormal gait;Decreased strength;Difficulty walking;Decreased mobility;Decreased range of motion;Decreased balance;Decreased endurance;Pain;Impaired flexibility;Increased muscle spasms   Rehab Potential Good   PT Frequency 2x / week   PT Duration 8 weeks   PT Treatment/Interventions ADLs/Self Care Home Management;Cryotherapy;Electrical Stimulation;Moist Heat;Patient/family education;Taping;Energy conservation;Passive range of motion;Neuromuscular re-education;Balance training;Therapeutic exercise;Therapeutic activities;Functional mobility training;Stair training;Gait training;DME  Instruction;Manual techniques   PT Next Visit Plan Asses pain, FOTO, BERG if pain doesn't interfere too much.   Consulted and Agree with Plan of Care Patient        Problem List Patient Active Problem List   Diagnosis Date Noted  . Hypertension   . Incontinence   . Multinodular goiter   . Depression   . CLAUDICATION 12/02/2009  . CHEST PAIN 12/02/2009    COCHRAN,JENNIFER, PTA 02/19/2015, 9:29 AM  [facility] [address] [place], [place], [address] Phone: [phone]   Fax:  [phone]  Name: Kerry Garcia MRN: [id number] Date of Birth: [birth year]

## 2015-02-20 ENCOUNTER — Encounter (INDEPENDENT_AMBULATORY_CARE_PROVIDER_SITE_OTHER): Payer: Medicare Other | Admitting: Ophthalmology

## 2015-02-20 DIAGNOSIS — H35033 Hypertensive retinopathy, bilateral: Secondary | ICD-10-CM

## 2015-02-20 DIAGNOSIS — I10 Essential (primary) hypertension: Secondary | ICD-10-CM | POA: Diagnosis not present

## 2015-02-20 DIAGNOSIS — H34831 Tributary (branch) retinal vein occlusion, right eye, with macular edema: Secondary | ICD-10-CM

## 2015-02-20 DIAGNOSIS — H43813 Vitreous degeneration, bilateral: Secondary | ICD-10-CM | POA: Diagnosis not present

## 2015-02-24 ENCOUNTER — Ambulatory Visit: Payer: Medicare Other

## 2015-02-24 DIAGNOSIS — R269 Unspecified abnormalities of gait and mobility: Secondary | ICD-10-CM

## 2015-02-24 DIAGNOSIS — R29898 Other symptoms and signs involving the musculoskeletal system: Secondary | ICD-10-CM | POA: Diagnosis not present

## 2015-02-24 NOTE — Therapy (Signed)
[facility] [address] [place], [place], [address] Phone: [phone]   Fax:  [phone]  Physical Therapy Treatment  Patient Details  Name: Kerry Garcia MRN: [id number] Date of Birth: Dec 07, 1925 Referring Provider: Dr. [name]  Encounter Date: 02/24/2015      PT End of Session - 02/24/15 1010    Visit Number 10   Number of Visits 20   Date for PT Re-Evaluation 03/11/15   PT Start Time 0931   PT Stop Time 1012   PT Time Calculation (min) 41 min   Activity Tolerance Patient tolerated treatment well   Behavior During Therapy [facility] for tasks assessed/performed      Past Medical History  Diagnosis Date  . Hypertension   . Incontinence   . Hypothyroidism   . Depression     Past Surgical History  Procedure Laterality Date  . Spine surgery    . Breast lumpectomy  1965    There were no vitals filed for this visit.  Visit Diagnosis:  Weakness of both lower extremities  Abnormality of gait      Subjective Assessment - 02/24/15 0937    Subjective I know that I am getting better because I can get out of the house more.  Pt reports 8/10 Lt knee pain yesterday.     Currently in Pain? Yes   Pain Score 5    Pain Location Leg   Pain Orientation Left   Pain Descriptors / Indicators Sharp   Pain Type Chronic pain   Pain Onset More than a month ago   Pain Frequency Intermittent   Aggravating Factors  constant, standing and walking   Pain Relieving Factors Tylenol/Aspirin, sitting   Multiple Pain Sites No            [facility] PT Assessment - 02/24/15 0001    Assessment   Medical Diagnosis gait difficulty   Cognition   Overall Cognitive Status Within Functional Limits for tasks assessed   Berg Balance Test   Sit to Stand Able to stand using hands after several tries   Standing Unsupported Able to stand 2 minutes with supervision   Sitting with Back Unsupported but Feet Supported on Floor  or Stool Able to sit safely and securely 2 minutes   Stand to Sit Uses backs of legs against chair to control descent   Transfers Able to transfer safely, definite need of hands   Standing Unsupported with Eyes Closed Able to stand 10 seconds with supervision   Standing Ubsupported with Feet Together Able to place feet together independently and stand for 1 minute with supervision   From Standing, Reach Forward with Outstretched Arm Can reach forward >5 cm safely (2")   From Standing Position, Pick up Object from Floor Able to pick up shoe, needs supervision   From Standing Position, Turn to Look Behind Over each Shoulder Turn sideways only but maintains balance   Turn 360 Degrees Needs close supervision or verbal cueing   Standing Unsupported, Alternately Place Feet on Step/Stool Needs assistance to keep from falling or unable to try   Standing Unsupported, One Foot in Front Able to take small step independently and hold 30 seconds   Standing on One Leg Tries to lift leg/unable to hold 3 seconds but remains standing independently   Total Score 31   Berg comment: High risk for falls  [facility] Adult PT Treatment/Exercise - 2015-03-24 0001    Knee/Hip Exercises: Aerobic   Nustep L1 x 7 min   Knee/Hip Exercises: Seated   Long Arc Quad Strengthening;Both;10 reps  5 sec hold   Ball Squeeze 2x10   Marching Strengthening;Both;10 reps   Abduction/Adduction  Both;20 reps  with yellow t band                  PT Short Term Goals - 03/24/15 0934    PT SHORT TERM GOAL #1   Title Pt will be independent in initial HEP   Status Achieved   PT SHORT TERM GOAL #2   Title Pt will improve BERG by 5 points to demo improved balance   Status Achieved   PT SHORT TERM GOAL #3   Title Pt will tolerate walking x 5 minutes without rest breaks   Status Achieved           PT Long Term Goals - Mar 24, 2015 0935    PT LONG TERM GOAL #1   Title Pt will be independent in  HEP   Time 8   Period Weeks   Status On-going   PT LONG TERM GOAL #2   Title Pt will improve BERG to 35/56 to demo improved balance   Time 8   Period Weeks   Status On-going   PT LONG TERM GOAL #3   Title Pt will tolerate gait x 10 minutes without rest breaks   Time 8   Period Weeks   Status On-going               Plan - Mar 24, 2015 0957    Clinical Impression Statement Pt with continued balance deficits and Lt knee pain that limits standing function and long distance ambulation.  Berg Balance Score is 31/56 today.  Pt with intermittent Lt knee pain sometimes up to 8/10.  Pt reports 70% improved confidence with gait and balance.  Pt reports that she is not sitting as much as she used to.  Pt will continue to benefit from skilled PT for gait, balance and eudurance tasks.     Pt will benefit from skilled therapeutic intervention in order to improve on the following deficits Abnormal gait;Decreased strength;Difficulty walking;Decreased mobility;Decreased range of motion;Decreased balance;Decreased endurance;Pain;Impaired flexibility;Increased muscle spasms   Rehab Potential Good   PT Frequency 2x / week   PT Duration 8 weeks   PT Treatment/Interventions ADLs/Self Care Home Management;Cryotherapy;Electrical Stimulation;Moist Heat;Patient/family education;Taping;Energy conservation;Passive range of motion;Neuromuscular re-education;Balance training;Therapeutic exercise;Therapeutic activities;Functional mobility training;Stair training;Gait training;DME Instruction;Manual techniques   PT Next Visit Plan Continue LE strength, gait and balance training.  Plan D/C 03/06/15 to HEP   Consulted and Agree with Plan of Care Patient          G-Codes - 24-Mar-2015 0957    Functional Assessment Tool Used Merrilee Jansky Balance Test: 31/56   Functional Limitation Mobility: Walking and moving around   Mobility: Walking and Moving Around Current Status [phone]) At least 40 percent but less than 60 percent  impaired, limited or restricted   Mobility: Walking and Moving Around Goal Status [phone]) At least 20 percent but less than 40 percent impaired, limited or restricted      Problem List Patient Active Problem List   Diagnosis Date Noted  . Hypertension   . Incontinence   . Multinodular goiter   . Depression   . CLAUDICATION 12/02/2009  . CHEST PAIN 12/02/2009  Physical Therapy Progress Note  Dates of Reporting Period: 01/14/15 to Mar 24, 2015  Objective Reports of Subjective Statement: Pt reports 70% overall improvement since the start of care.  Pt is moving around more instead of sitting all of the time.    Objective Measurements: Merrilee Jansky Balance Test: 31/56  Goal Update: See above  Plan: Pt will continue until 03/06/15 with probable D/C to HEP.  PT will work over the next 2 weeks to build a comprehensive HEP for strength and balance training.    Reason Skilled Services are Required: Pt with continued balance and endurance deficits and will benefit from skilled PT for strength and balance training to improve independence and safety.     Detroit Frieden, PT 02/24/2015, 10:19 AM  [facility] [address], [place], [place], [address] Phone: [phone]   Fax:  [phone]  Name: Kerry Garcia MRN: [id number] Date of Birth: 1925/11/30

## 2015-02-26 ENCOUNTER — Encounter: Payer: Self-pay | Admitting: Physical Therapy

## 2015-02-26 ENCOUNTER — Ambulatory Visit: Payer: Medicare Other | Admitting: Physical Therapy

## 2015-02-26 DIAGNOSIS — R29898 Other symptoms and signs involving the musculoskeletal system: Secondary | ICD-10-CM | POA: Diagnosis not present

## 2015-02-26 DIAGNOSIS — R269 Unspecified abnormalities of gait and mobility: Secondary | ICD-10-CM | POA: Diagnosis not present

## 2015-02-26 NOTE — Therapy (Signed)
[facility] [address], [place], [place], [address] Phone: [phone]   Fax:  [phone]  Physical Therapy Treatment  Patient Details  Name: Kerry Garcia MRN: [id number] Date of Birth: 1926-02-17 Referring Stan Cantave: Dr. [name]  Encounter Date: 02/26/2015      PT End of Session - 02/26/15 0948    Visit Number 11   Number of Visits 20   Date for PT Re-Evaluation 03/11/15   PT Start Time [phone]  Late getting to pt bc of previous pt.   PT Stop Time 1015   PT Time Calculation (min) 33 min   Activity Tolerance Patient limited by pain   Behavior During Therapy [facility] for tasks assessed/performed      Past Medical History  Diagnosis Date  . Hypertension   . Incontinence   . Hypothyroidism   . Depression     Past Surgical History  Procedure Laterality Date  . Spine surgery    . Breast lumpectomy  1965    There were no vitals filed for this visit.  Visit Diagnosis:  Weakness of both lower extremities  Abnormality of gait      Subjective Assessment - 02/26/15 0945    Subjective Pretty good this morning. Distal to RT knee is pretty painful. Better today than the past weekend.    Currently in Pain? Yes   Pain Score 5    Pain Location Knee   Pain Orientation Left   Pain Descriptors / Indicators Shooting;Sharp   Aggravating Factors  Pretty constant, weight bearing   Pain Relieving Factors meds, rest   Multiple Pain Sites No                         [facility] Adult PT Treatment/Exercise - 02/26/15 0001    Knee/Hip Exercises: Aerobic   Nustep L2 x 10 minin   Knee/Hip Exercises: Seated   Long Arc Quad Strengthening;Both;2 sets;10 reps   Long Arc Quad Limitations LT knee limited secondary to pain   [facility] 2x10   Marching Strengthening;Both;2 sets;10 reps   Sit to [employer] x 4 min seated                  PT Short Term Goals - 02/24/15 0934    PT  SHORT TERM GOAL #1   Title Pt will be independent in initial HEP   Status Achieved   PT SHORT TERM GOAL #2   Title Pt will improve BERG by 5 points to demo improved balance   Status Achieved   PT SHORT TERM GOAL #3   Title Pt will tolerate walking x 5 minutes without rest breaks   Status Achieved           PT Long Term Goals - 02/24/15 0935    PT LONG TERM GOAL #1   Title Pt will be independent in HEP   Time 8   Period Weeks   Status On-going   PT LONG TERM GOAL #2   Title Pt will improve BERG to 35/56 to demo improved balance   Time 8   Period Weeks   Status On-going   PT LONG TERM GOAL #3   Title Pt will tolerate gait x 10 minutes without rest breaks   Time 8   Period Weeks   Status On-going               Plan - 02/26/15 [id number]  Clinical Impression Statement Knee pain on LT moderate today. Walking with less pain. Still limited in weighbearing as this increases her knee pain. Might try 1# wts next visit.    Pt will benefit from skilled therapeutic intervention in order to improve on the following deficits Abnormal gait;Decreased strength;Difficulty walking;Decreased mobility;Decreased range of motion;Decreased balance;Decreased endurance;Pain;Impaired flexibility;Increased muscle spasms   Rehab Potential Good   PT Frequency 2x / week   PT Duration 8 weeks   PT Treatment/Interventions ADLs/Self Care Home Management;Cryotherapy;Electrical Stimulation;Moist Heat;Patient/family education;Taping;Energy conservation;Passive range of motion;Neuromuscular re-education;Balance training;Therapeutic exercise;Therapeutic activities;Functional mobility training;Stair training;Gait training;DME Instruction;Manual techniques   PT Next Visit Plan Try 1# on marching, DC 03/06/15 per PT plan.    Consulted and Agree with Plan of Care Patient        Problem List Patient Active Problem List   Diagnosis Date Noted  . Hypertension   . Incontinence   . Multinodular goiter   .  Depression   . CLAUDICATION 12/02/2009  . CHEST PAIN 12/02/2009    COCHRAN,JENNIFER, PTA 02/26/2015, 10:04 AM  [facility] [address], [place], [place], [address] Phone: [phone]   Fax:  [phone]  Name: Kerry Garcia MRN: [id number] Date of Birth: 12-05-1925

## 2015-03-04 ENCOUNTER — Ambulatory Visit: Payer: Medicare Other | Admitting: Physical Therapy

## 2015-03-04 DIAGNOSIS — R29898 Other symptoms and signs involving the musculoskeletal system: Secondary | ICD-10-CM

## 2015-03-04 DIAGNOSIS — R269 Unspecified abnormalities of gait and mobility: Secondary | ICD-10-CM

## 2015-03-04 NOTE — Therapy (Signed)
[facility] [address], [place], [place], [address] Phone: [phone]   Fax:  [phone]  Physical Therapy Treatment  Patient Details  Name: Kerry Garcia MRN: [id number] Date of Birth: 07-Aug-1925 Referring Provider: Dr. [name]  Encounter Date: 03/04/2015      PT End of Session - 03/04/15 0924    Visit Number 12   Number of Visits 20   Date for PT Re-Evaluation 03/11/15   PT Start Time 0842   PT Stop Time 0925   PT Time Calculation (min) 43 min   Activity Tolerance Patient tolerated treatment well   Behavior During Therapy [facility] for tasks assessed/performed      Past Medical History  Diagnosis Date  . Hypertension   . Incontinence   . Hypothyroidism   . Depression     Past Surgical History  Procedure Laterality Date  . Spine surgery    . Breast lumpectomy  1965    There were no vitals filed for this visit.  Visit Diagnosis:  Weakness of both lower extremities  Abnormality of gait      Subjective Assessment - 03/04/15 0849    Subjective "I feel much better"  Pt states she took meds and they seem to be working for knee pain   Currently in Pain? No/denies                         [facility] Adult PT Treatment/Exercise - 03/04/15 0001    High Level Balance   High Level Balance Activities Direction changes;Turns;Sudden stops;Head turns   High Level Balance Comments standing on foam min A 2 x 1 min   Knee/Hip Exercises: Aerobic   Nustep L3 x 7 mins   Knee/Hip Exercises: Standing   Forward Step Up --  tap ups for balance 2 x 10    Knee/Hip Exercises: Seated   Long Arc Quad Strengthening;Both;10 reps  5 sec hold   Ball Squeeze 2x10   Sit to [employer] 2 sets;5 reps                  PT Short Term Goals - 03/04/15 0925    PT SHORT TERM GOAL #1   Title Pt will be independent in initial HEP   Status Achieved   PT SHORT TERM GOAL #2   Title Pt will improve BERG  by 5 points to demo improved balance   Status Achieved   PT SHORT TERM GOAL #3   Title Pt will tolerate walking x 5 minutes without rest breaks   Status Achieved           PT Long Term Goals - 03/04/15 0925    PT LONG TERM GOAL #1   Title Pt will be independent in HEP   Status On-going   PT LONG TERM GOAL #2   Title Pt will improve BERG to 35/56 to demo improved balance   Status On-going   PT LONG TERM GOAL #3   Title Pt will tolerate gait x 10 minutes without rest breaks   Status On-going               Plan - 03/04/15 0924    Clinical Impression Statement Pt feeling much better, improved gait stability today with various high level balance tasks.  Pt ready for d/c next visit   Pt will benefit from skilled therapeutic intervention in order to improve on the following deficits Abnormal gait;Decreased strength;Difficulty walking;Decreased  mobility;Decreased range of motion;Decreased balance;Decreased endurance;Pain;Impaired flexibility;Increased muscle spasms   PT Frequency 2x / week   PT Duration 8 weeks   PT Treatment/Interventions ADLs/Self Care Home Management;Cryotherapy;Electrical Stimulation;Moist Heat;Patient/family education;Taping;Energy conservation;Passive range of motion;Neuromuscular re-education;Balance training;Therapeutic exercise;Therapeutic activities;Functional mobility training;Stair training;Gait training;DME Instruction;Manual techniques   PT Next Visit Plan redo berg for d/c   Consulted and Agree with Plan of Care Patient        Problem List Patient Active Problem List   Diagnosis Date Noted  . Hypertension   . Incontinence   . Multinodular goiter   . Depression   . CLAUDICATION 12/02/2009  . CHEST PAIN 12/02/2009    Isabelle Course, PT, DPT  03/04/2015, 9:26 AM  [facility] [address] [place], [place], [address] Phone: [phone]   Fax:  [phone]  Name: Kerry Garcia MRN: [id number] Date of Birth: 04-10-1925

## 2015-03-05 DIAGNOSIS — E042 Nontoxic multinodular goiter: Secondary | ICD-10-CM | POA: Diagnosis not present

## 2015-03-05 DIAGNOSIS — I10 Essential (primary) hypertension: Secondary | ICD-10-CM | POA: Diagnosis not present

## 2015-03-05 DIAGNOSIS — Z6835 Body mass index (BMI) 35.0-35.9, adult: Secondary | ICD-10-CM | POA: Diagnosis not present

## 2015-03-05 DIAGNOSIS — Z1389 Encounter for screening for other disorder: Secondary | ICD-10-CM | POA: Diagnosis not present

## 2015-03-05 DIAGNOSIS — E785 Hyperlipidemia, unspecified: Secondary | ICD-10-CM | POA: Diagnosis not present

## 2015-03-05 DIAGNOSIS — F329 Major depressive disorder, single episode, unspecified: Secondary | ICD-10-CM | POA: Diagnosis not present

## 2015-03-05 DIAGNOSIS — Z0000 Encounter for general adult medical examination without abnormal findings: Secondary | ICD-10-CM | POA: Diagnosis not present

## 2015-03-05 DIAGNOSIS — E663 Overweight: Secondary | ICD-10-CM | POA: Diagnosis not present

## 2015-03-06 ENCOUNTER — Ambulatory Visit: Payer: Medicare Other

## 2015-03-06 DIAGNOSIS — R29898 Other symptoms and signs involving the musculoskeletal system: Secondary | ICD-10-CM | POA: Diagnosis not present

## 2015-03-06 DIAGNOSIS — R269 Unspecified abnormalities of gait and mobility: Secondary | ICD-10-CM | POA: Diagnosis not present

## 2015-03-06 NOTE — Therapy (Signed)
[facility] [address] [place], [place], [address] Phone: [phone]   Fax:  [phone]  Physical Therapy Treatment  Patient Details  Name: Kerry Garcia MRN: [id number] Date of Birth: [birth year] Referring Provider: Dr. [name]  Encounter Date: 03/06/2015      PT End of Session - 03/06/15 1007    Visit Number 13   PT Start Time 0927   PT Stop Time 1010   PT Time Calculation (min) 43 min   Activity Tolerance Patient tolerated treatment well   Behavior During Therapy [facility] for tasks assessed/performed      Past Medical History  Diagnosis Date  . Hypertension   . Incontinence   . Hypothyroidism   . Depression     Past Surgical History  Procedure Laterality Date  . Spine surgery    . Breast lumpectomy  1965    There were no vitals filed for this visit.  Visit Diagnosis:  Weakness of both lower extremities  Abnormality of gait      Subjective Assessment - 03/06/15 1001    Subjective feeling better with therapy.  70% overall improvement.  Mental status is improved.   Currently in Pain? Yes   Pain Score 3    Pain Location Knee   Pain Orientation Left            [facility] PT Assessment - 03/06/15 0001    Assessment   Medical Diagnosis gait difficulty   Cognition   Overall Cognitive Status Within Functional Limits for tasks assessed   Berg Balance Test   Sit to Stand Able to stand using hands after several tries   Standing Unsupported Able to stand 2 minutes with supervision   Sitting with Back Unsupported but Feet Supported on Floor or Stool Able to sit safely and securely 2 minutes   Stand to Sit Uses backs of legs against chair to control descent   Transfers Able to transfer safely, definite need of hands   Standing Unsupported with Eyes Closed Able to stand 10 seconds with supervision   Standing Ubsupported with Feet Together Able to place feet together independently and stand  for 1 minute with supervision   From Standing, Reach Forward with Outstretched Arm Can reach forward >5 cm safely (2")   From Standing Position, Pick up Object from Floor Able to pick up shoe, needs supervision   From Standing Position, Turn to Look Behind Over each Shoulder Turn sideways only but maintains balance   Turn 360 Degrees Needs close supervision or verbal cueing   Standing Unsupported, Alternately Place Feet on Step/Stool Needs assistance to keep from falling or unable to try   Standing Unsupported, One Foot in Front Able to take small step independently and hold 30 seconds   Standing on One Leg Tries to lift leg/unable to hold 3 seconds but remains standing independently   Total Score 31   Berg comment: High risk for falls                     [facility] Adult PT Treatment/Exercise - 03/06/15 0001    Knee/Hip Exercises: Aerobic   Nustep Level 3x10 minutes  seat 11, arms 10   Knee/Hip Exercises: Standing   Hip Abduction Stengthening;Both;10 reps   Hip Extension Stengthening;Both;10 reps   Knee/Hip Exercises: Seated   Long Arc Quad Strengthening;Both;10 reps  5 sec hold   Ball Squeeze 2x10   Marching Strengthening;Both;2 sets;10 reps   Sit to [employer]  2 sets;10 reps                  PT Short Term Goals - 04/02/15 0956    PT SHORT TERM GOAL #1   Title Pt will be independent in initial HEP   Status Achieved   PT SHORT TERM GOAL #2   Title Pt will improve BERG by 5 points to demo improved balance   Status Achieved   PT SHORT TERM GOAL #3   Title Pt will tolerate walking x 5 minutes without rest breaks   Status Achieved           PT Long Term Goals - 04-02-2015 0940    PT LONG TERM GOAL #1   Title Pt will be independent in HEP   Status Achieved   PT LONG TERM GOAL #2   Title Pt will improve BERG to 35/56 to demo improved balance   Status Partially Met   PT LONG TERM GOAL #3   Title Pt will tolerate gait x 10 minutes without rest breaks   Status  Partially Met  5 minutes max               Plan - 2015-04-02 0945    Clinical Impression Statement Pt reports that her leg pain limits her ability to stand and walk for long distances.  Pt reports up to 7/10 leg pain at times and it reduces to 2/10 with medication.  Berg Balance score is 31/56, improved from evaluation but still indicates falls risk.  Pt is limited to standing and walking 5 minutes due to pain.  Pt reports 70% improvement overall.  Pt will be discharged to HEP.   PT Next Visit Plan D/C PT to HEP   Consulted and Agree with Plan of Care Patient          G-Codes - 2015-04-02 1001    Functional Assessment Tool Used Berg Balance Test: 31/56   Functional Limitation Mobility: Walking and moving around   Mobility: Walking and Moving Around Goal Status [phone]) At least 20 percent but less than 40 percent impaired, limited or restricted   Mobility: Walking and Moving Around Discharge Status [phone]) At least 40 percent but less than 60 percent impaired, limited or restricted      Problem List Patient Active Problem List   Diagnosis Date Noted  . Hypertension   . Incontinence   . Multinodular goiter   . Depression   . CLAUDICATION 12/02/2009  . CHEST PAIN 12/02/2009  PHYSICAL THERAPY DISCHARGE SUMMARY  Visits from Start of Care: 13 Current functional level related to goals / functional outcomes: See above for current status.    Remaining deficits: Continued chronic balance deficits and LE pain.  Pt has HEP in place for continued strength progression.     Education / Equipment: Fall prevention, HEP Plan: Patient agrees to discharge.  Patient goals were partially met. Patient is being discharged due to being pleased with the current functional level.  ?????      Avaree Gilberti, PT 04-02-2015, 10:10 AM  [facility] [address] [place], [place], [address] Phone: [phone]   Fax:   [phone]  Name: BRANDALYN HARTING MRN: [id number] Date of Birth: October 17, 1925

## 2015-03-20 ENCOUNTER — Encounter (INDEPENDENT_AMBULATORY_CARE_PROVIDER_SITE_OTHER): Payer: Medicare Other | Admitting: Ophthalmology

## 2015-03-20 DIAGNOSIS — H34831 Tributary (branch) retinal vein occlusion, right eye, with macular edema: Secondary | ICD-10-CM | POA: Diagnosis not present

## 2015-03-20 DIAGNOSIS — H35033 Hypertensive retinopathy, bilateral: Secondary | ICD-10-CM

## 2015-03-20 DIAGNOSIS — H43813 Vitreous degeneration, bilateral: Secondary | ICD-10-CM | POA: Diagnosis not present

## 2015-03-20 DIAGNOSIS — I10 Essential (primary) hypertension: Secondary | ICD-10-CM

## 2015-03-26 DIAGNOSIS — H401121 Primary open-angle glaucoma, left eye, mild stage: Secondary | ICD-10-CM | POA: Diagnosis not present

## 2015-03-26 DIAGNOSIS — H40052 Ocular hypertension, left eye: Secondary | ICD-10-CM | POA: Diagnosis not present

## 2015-04-17 ENCOUNTER — Ambulatory Visit (INDEPENDENT_AMBULATORY_CARE_PROVIDER_SITE_OTHER): Payer: Medicare Other | Admitting: Podiatry

## 2015-04-17 ENCOUNTER — Encounter: Payer: Self-pay | Admitting: Podiatry

## 2015-04-17 ENCOUNTER — Encounter (INDEPENDENT_AMBULATORY_CARE_PROVIDER_SITE_OTHER): Payer: Medicare Other | Admitting: Ophthalmology

## 2015-04-17 DIAGNOSIS — M79673 Pain in unspecified foot: Secondary | ICD-10-CM | POA: Diagnosis not present

## 2015-04-17 DIAGNOSIS — B351 Tinea unguium: Secondary | ICD-10-CM

## 2015-04-17 DIAGNOSIS — H34831 Tributary (branch) retinal vein occlusion, right eye, with macular edema: Secondary | ICD-10-CM

## 2015-04-17 DIAGNOSIS — I739 Peripheral vascular disease, unspecified: Secondary | ICD-10-CM

## 2015-04-17 DIAGNOSIS — M79609 Pain in unspecified limb: Principal | ICD-10-CM

## 2015-04-17 NOTE — Progress Notes (Signed)
Patient ID: Kerry Garcia, female   DOB: May 17, 1925, 82 y.o.   MRN: [id number] Complaint:  Visit Type: Patient returns to my office for continued preventative foot care services. Complaint: Patient states" my nails have grown long and thick and become painful to walk and wear shoes" . The patient presents for preventative foot care services. No changes to ROS  Podiatric Exam: Vascular: dorsalis pedis pulses are palpable bilateral. Her posterior tibial pulses are non palpable. . Capillary return is immediate. Temperature gradient is WNL. Skin turgor WNL  Sensorium: Normal Semmes Weinstein monofilament test. Normal tactile sensation bilaterally. Nail Exam: Pt has thick disfigured discolored nails with subungual debris noted bilateral entire nail hallux through fifth toenails Ulcer Exam: There is no evidence of ulcer or pre-ulcerative changes or infection. Orthopedic Exam: Muscle tone and strength are WNL. No limitations in general ROM. No crepitus or effusions noted. Foot type and digits show no abnormalities. Bony prominences are unremarkable. Skin: No Porokeratosis. No infection or ulcers  Diagnosis:  Onychomycosis, , Pain in right toes, pain in left toes  Treatment & Plan Procedures and Treatment: Consent by patient was obtained for treatment procedures. The patient understood the discussion of treatment and procedures well. All questions were answered thoroughly reviewed. Debridement of mycotic and hypertrophic toenails, 1 through 5 bilateral and clearing of subungual debris. No ulceration, no infection noted.  Return Visit-Office Procedure: Patient instructed to return to the office for a follow up visit 3 months for continued evaluation and treatment.    Gardiner Barefoot DPM

## 2015-05-01 ENCOUNTER — Encounter (INDEPENDENT_AMBULATORY_CARE_PROVIDER_SITE_OTHER): Payer: Medicare Other | Admitting: Ophthalmology

## 2015-05-01 DIAGNOSIS — H35033 Hypertensive retinopathy, bilateral: Secondary | ICD-10-CM

## 2015-05-01 DIAGNOSIS — H43813 Vitreous degeneration, bilateral: Secondary | ICD-10-CM | POA: Diagnosis not present

## 2015-05-01 DIAGNOSIS — I10 Essential (primary) hypertension: Secondary | ICD-10-CM

## 2015-05-01 DIAGNOSIS — H34831 Tributary (branch) retinal vein occlusion, right eye, with macular edema: Secondary | ICD-10-CM

## 2015-05-06 DIAGNOSIS — H401131 Primary open-angle glaucoma, bilateral, mild stage: Secondary | ICD-10-CM | POA: Diagnosis not present

## 2015-06-03 DIAGNOSIS — H04123 Dry eye syndrome of bilateral lacrimal glands: Secondary | ICD-10-CM | POA: Diagnosis not present

## 2015-06-03 DIAGNOSIS — H401131 Primary open-angle glaucoma, bilateral, mild stage: Secondary | ICD-10-CM | POA: Diagnosis not present

## 2015-06-26 DIAGNOSIS — H401131 Primary open-angle glaucoma, bilateral, mild stage: Secondary | ICD-10-CM | POA: Diagnosis not present

## 2015-07-10 ENCOUNTER — Encounter (INDEPENDENT_AMBULATORY_CARE_PROVIDER_SITE_OTHER): Payer: Medicare Other | Admitting: Ophthalmology

## 2015-07-10 DIAGNOSIS — I10 Essential (primary) hypertension: Secondary | ICD-10-CM | POA: Diagnosis not present

## 2015-07-10 DIAGNOSIS — H43813 Vitreous degeneration, bilateral: Secondary | ICD-10-CM | POA: Diagnosis not present

## 2015-07-10 DIAGNOSIS — H35033 Hypertensive retinopathy, bilateral: Secondary | ICD-10-CM

## 2015-07-10 DIAGNOSIS — H34831 Tributary (branch) retinal vein occlusion, right eye, with macular edema: Secondary | ICD-10-CM | POA: Diagnosis not present

## 2015-07-17 ENCOUNTER — Ambulatory Visit (INDEPENDENT_AMBULATORY_CARE_PROVIDER_SITE_OTHER): Payer: Medicare Other | Admitting: Podiatry

## 2015-07-17 ENCOUNTER — Encounter: Payer: Self-pay | Admitting: Podiatry

## 2015-07-17 DIAGNOSIS — M79673 Pain in unspecified foot: Secondary | ICD-10-CM | POA: Diagnosis not present

## 2015-07-17 DIAGNOSIS — M79609 Pain in unspecified limb: Principal | ICD-10-CM

## 2015-07-17 DIAGNOSIS — B351 Tinea unguium: Secondary | ICD-10-CM | POA: Diagnosis not present

## 2015-07-17 DIAGNOSIS — I739 Peripheral vascular disease, unspecified: Secondary | ICD-10-CM

## 2015-07-17 NOTE — Progress Notes (Signed)
Patient ID: Kerry Garcia, female   DOB: [birth year], 82 y.o.   MRN: [id number] Complaint:  Visit Type: Patient returns to my office for continued preventative foot care services. Complaint: Patient states" my nails have grown long and thick and become painful to walk and wear shoes" . The patient presents for preventative foot care services. No changes to ROS  Podiatric Exam: Vascular: dorsalis pedis pulses are palpable bilateral. Her posterior tibial pulses are non palpable. . Capillary return is immediate. Temperature gradient is WNL. Skin turgor WNL  Sensorium: Normal [employer] monofilament test. Normal tactile sensation bilaterally. Nail Exam: Pt has thick disfigured discolored nails with subungual debris noted bilateral entire nail hallux through fifth toenails Ulcer Exam: There is no evidence of ulcer or pre-ulcerative changes or infection. Orthopedic Exam: Muscle tone and strength are WNL. No limitations in general ROM. No crepitus or effusions noted. Foot type and digits show no abnormalities. Bony prominences are unremarkable. Skin: No Porokeratosis. No infection or ulcers  Diagnosis:  Onychomycosis, , Pain in right toes, pain in left toes  Treatment & Plan Procedures and Treatment: Consent by patient was obtained for treatment procedures. The patient understood the discussion of treatment and procedures well. All questions were answered thoroughly reviewed. Debridement of mycotic and hypertrophic toenails, 1 through 5 bilateral and clearing of subungual debris. No ulceration, no infection noted.  Return Visit-Office Procedure: Patient instructed to return to the office for a follow up visit 3 months for continued evaluation and treatment.    Gardiner Barefoot DPM

## 2015-08-28 ENCOUNTER — Encounter (INDEPENDENT_AMBULATORY_CARE_PROVIDER_SITE_OTHER): Payer: Medicare Other | Admitting: Ophthalmology

## 2015-08-28 DIAGNOSIS — H43813 Vitreous degeneration, bilateral: Secondary | ICD-10-CM | POA: Diagnosis not present

## 2015-08-28 DIAGNOSIS — I10 Essential (primary) hypertension: Secondary | ICD-10-CM | POA: Diagnosis not present

## 2015-08-28 DIAGNOSIS — H34831 Tributary (branch) retinal vein occlusion, right eye, with macular edema: Secondary | ICD-10-CM | POA: Diagnosis not present

## 2015-08-28 DIAGNOSIS — H35033 Hypertensive retinopathy, bilateral: Secondary | ICD-10-CM | POA: Diagnosis not present

## 2015-09-03 DIAGNOSIS — I10 Essential (primary) hypertension: Secondary | ICD-10-CM | POA: Diagnosis not present

## 2015-09-03 DIAGNOSIS — E042 Nontoxic multinodular goiter: Secondary | ICD-10-CM | POA: Diagnosis not present

## 2015-09-03 DIAGNOSIS — R350 Frequency of micturition: Secondary | ICD-10-CM | POA: Diagnosis not present

## 2015-09-03 DIAGNOSIS — E7800 Pure hypercholesterolemia, unspecified: Secondary | ICD-10-CM | POA: Diagnosis not present

## 2015-09-03 DIAGNOSIS — R269 Unspecified abnormalities of gait and mobility: Secondary | ICD-10-CM | POA: Diagnosis not present

## 2015-09-17 DIAGNOSIS — R8290 Unspecified abnormal findings in urine: Secondary | ICD-10-CM | POA: Diagnosis not present

## 2015-09-17 DIAGNOSIS — R42 Dizziness and giddiness: Secondary | ICD-10-CM | POA: Diagnosis not present

## 2015-09-17 DIAGNOSIS — H6121 Impacted cerumen, right ear: Secondary | ICD-10-CM | POA: Diagnosis not present

## 2015-09-18 ENCOUNTER — Ambulatory Visit: Payer: Medicare Other | Admitting: Physical Therapy

## 2015-09-24 DIAGNOSIS — D2271 Melanocytic nevi of right lower limb, including hip: Secondary | ICD-10-CM | POA: Diagnosis not present

## 2015-09-24 DIAGNOSIS — D2230 Melanocytic nevi of unspecified part of face: Secondary | ICD-10-CM | POA: Diagnosis not present

## 2015-09-24 DIAGNOSIS — D225 Melanocytic nevi of trunk: Secondary | ICD-10-CM | POA: Diagnosis not present

## 2015-09-24 DIAGNOSIS — L509 Urticaria, unspecified: Secondary | ICD-10-CM | POA: Diagnosis not present

## 2015-09-24 DIAGNOSIS — D224 Melanocytic nevi of scalp and neck: Secondary | ICD-10-CM | POA: Diagnosis not present

## 2015-09-24 DIAGNOSIS — D239 Other benign neoplasm of skin, unspecified: Secondary | ICD-10-CM | POA: Diagnosis not present

## 2015-09-24 DIAGNOSIS — D229 Melanocytic nevi, unspecified: Secondary | ICD-10-CM | POA: Diagnosis not present

## 2015-09-24 DIAGNOSIS — D2272 Melanocytic nevi of left lower limb, including hip: Secondary | ICD-10-CM | POA: Diagnosis not present

## 2015-09-25 DIAGNOSIS — H401121 Primary open-angle glaucoma, left eye, mild stage: Secondary | ICD-10-CM | POA: Diagnosis not present

## 2015-09-25 DIAGNOSIS — H401111 Primary open-angle glaucoma, right eye, mild stage: Secondary | ICD-10-CM | POA: Diagnosis not present

## 2015-10-09 ENCOUNTER — Ambulatory Visit (INDEPENDENT_AMBULATORY_CARE_PROVIDER_SITE_OTHER): Payer: Medicare Other | Admitting: Podiatry

## 2015-10-09 ENCOUNTER — Encounter: Payer: Self-pay | Admitting: Podiatry

## 2015-10-09 DIAGNOSIS — M79609 Pain in unspecified limb: Principal | ICD-10-CM

## 2015-10-09 DIAGNOSIS — B351 Tinea unguium: Secondary | ICD-10-CM | POA: Diagnosis not present

## 2015-10-09 DIAGNOSIS — M79676 Pain in unspecified toe(s): Secondary | ICD-10-CM

## 2015-10-09 DIAGNOSIS — I739 Peripheral vascular disease, unspecified: Secondary | ICD-10-CM

## 2015-10-09 NOTE — Progress Notes (Signed)
Patient ID: Kerry Garcia, female   DOB: 06/30/1925, 82 y.o.   MRN: [id number] Complaint:  Visit Type: Patient returns to my office for continued preventative foot care services. Complaint: Patient states" my nails have grown long and thick and become painful to walk and wear shoes" . The patient presents for preventative foot care services. No changes to ROS  Podiatric Exam: Vascular: dorsalis pedis pulses are palpable bilateral. Her posterior tibial pulses are non palpable. . Capillary return is immediate. Temperature gradient is WNL. Skin turgor WNL  Sensorium: Normal [employer] monofilament test. Normal tactile sensation bilaterally. Nail Exam: Pt has thick disfigured discolored nails with subungual debris noted bilateral entire nail hallux through fifth toenails Ulcer Exam: There is no evidence of ulcer or pre-ulcerative changes or infection. Orthopedic Exam: Muscle tone and strength are WNL. No limitations in general ROM. No crepitus or effusions noted. Foot type and digits show no abnormalities. Bony prominences are unremarkable. Skin: No Porokeratosis. No infection or ulcers  Diagnosis:  Onychomycosis, , Pain in right toes, pain in left toes  Treatment & Plan Procedures and Treatment: Consent by patient was obtained for treatment procedures. The patient understood the discussion of treatment and procedures well. All questions were answered thoroughly reviewed. Debridement of mycotic and hypertrophic toenails, 1 through 5 bilateral and clearing of subungual debris. No ulceration, no infection noted.  Return Visit-Office Procedure: Patient instructed to return to the office for a follow up visit 3 months for continued evaluation and treatment.    Kushal Saunders DPM 

## 2015-10-23 ENCOUNTER — Encounter (INDEPENDENT_AMBULATORY_CARE_PROVIDER_SITE_OTHER): Payer: Medicare Other | Admitting: Ophthalmology

## 2015-10-23 DIAGNOSIS — H43813 Vitreous degeneration, bilateral: Secondary | ICD-10-CM | POA: Diagnosis not present

## 2015-10-23 DIAGNOSIS — H34831 Tributary (branch) retinal vein occlusion, right eye, with macular edema: Secondary | ICD-10-CM

## 2015-10-23 DIAGNOSIS — I10 Essential (primary) hypertension: Secondary | ICD-10-CM | POA: Diagnosis not present

## 2015-10-23 DIAGNOSIS — H35033 Hypertensive retinopathy, bilateral: Secondary | ICD-10-CM

## 2015-12-04 DIAGNOSIS — M778 Other enthesopathies, not elsewhere classified: Secondary | ICD-10-CM | POA: Diagnosis not present

## 2015-12-04 DIAGNOSIS — M19042 Primary osteoarthritis, left hand: Secondary | ICD-10-CM | POA: Diagnosis not present

## 2015-12-10 DIAGNOSIS — M79642 Pain in left hand: Secondary | ICD-10-CM | POA: Diagnosis not present

## 2015-12-10 DIAGNOSIS — M19042 Primary osteoarthritis, left hand: Secondary | ICD-10-CM | POA: Diagnosis not present

## 2015-12-10 DIAGNOSIS — M1812 Unilateral primary osteoarthritis of first carpometacarpal joint, left hand: Secondary | ICD-10-CM | POA: Diagnosis not present

## 2015-12-18 ENCOUNTER — Encounter (INDEPENDENT_AMBULATORY_CARE_PROVIDER_SITE_OTHER): Payer: Medicare Other | Admitting: Ophthalmology

## 2015-12-18 DIAGNOSIS — H43813 Vitreous degeneration, bilateral: Secondary | ICD-10-CM

## 2015-12-18 DIAGNOSIS — H34831 Tributary (branch) retinal vein occlusion, right eye, with macular edema: Secondary | ICD-10-CM | POA: Diagnosis not present

## 2015-12-18 DIAGNOSIS — I10 Essential (primary) hypertension: Secondary | ICD-10-CM | POA: Diagnosis not present

## 2015-12-18 DIAGNOSIS — H35033 Hypertensive retinopathy, bilateral: Secondary | ICD-10-CM

## 2015-12-24 DIAGNOSIS — M1812 Unilateral primary osteoarthritis of first carpometacarpal joint, left hand: Secondary | ICD-10-CM | POA: Diagnosis not present

## 2015-12-24 DIAGNOSIS — M79642 Pain in left hand: Secondary | ICD-10-CM | POA: Diagnosis not present

## 2015-12-30 DIAGNOSIS — G5612 Other lesions of median nerve, left upper limb: Secondary | ICD-10-CM | POA: Diagnosis not present

## 2016-01-01 ENCOUNTER — Ambulatory Visit (INDEPENDENT_AMBULATORY_CARE_PROVIDER_SITE_OTHER): Payer: Medicare Other | Admitting: Podiatry

## 2016-01-01 ENCOUNTER — Encounter: Payer: Self-pay | Admitting: Podiatry

## 2016-01-01 VITALS — BP 114/62 | HR 80 | Resp 14

## 2016-01-01 DIAGNOSIS — B351 Tinea unguium: Secondary | ICD-10-CM

## 2016-01-01 DIAGNOSIS — M79609 Pain in unspecified limb: Principal | ICD-10-CM

## 2016-01-01 DIAGNOSIS — I739 Peripheral vascular disease, unspecified: Secondary | ICD-10-CM

## 2016-01-01 DIAGNOSIS — M79676 Pain in unspecified toe(s): Secondary | ICD-10-CM | POA: Diagnosis not present

## 2016-01-01 NOTE — Progress Notes (Signed)
Patient ID: Kerry Garcia, female   DOB: 10/30/1925, 82 y.o.   MRN: [id number] Complaint:  Visit Type: Patient returns to my office for continued preventative foot care services. Complaint: Patient states" my nails have grown long and thick and become painful to walk and wear shoes" . The patient presents for preventative foot care services. No changes to ROS  Podiatric Exam: Vascular: dorsalis pedis pulses are palpable bilateral. Her posterior tibial pulses are non palpable. . Capillary return is immediate. Temperature gradient is WNL. Skin turgor WNL  Sensorium: Normal [employer] monofilament test. Normal tactile sensation bilaterally. Nail Exam: Pt has thick disfigured discolored nails with subungual debris noted bilateral entire nail hallux through fifth toenails Ulcer Exam: There is no evidence of ulcer or pre-ulcerative changes or infection. Orthopedic Exam: Muscle tone and strength are WNL. No limitations in general ROM. No crepitus or effusions noted. Foot type and digits show no abnormalities. Bony prominences are unremarkable. Skin: No Porokeratosis. No infection or ulcers  Diagnosis:  Onychomycosis, , Pain in right toes, pain in left toes  Treatment & Plan Procedures and Treatment: Consent by patient was obtained for treatment procedures. The patient understood the discussion of treatment and procedures well. All questions were answered thoroughly reviewed. Debridement of mycotic and hypertrophic toenails, 1 through 5 bilateral and clearing of subungual debris. No ulceration, no infection noted.  Return Visit-Office Procedure: Patient instructed to return to the office for a follow up visit 3 months for continued evaluation and treatment.    Krystofer Hevener DPM 

## 2016-01-02 DIAGNOSIS — M79642 Pain in left hand: Secondary | ICD-10-CM | POA: Diagnosis not present

## 2016-01-02 DIAGNOSIS — G5602 Carpal tunnel syndrome, left upper limb: Secondary | ICD-10-CM | POA: Insufficient documentation

## 2016-01-14 DIAGNOSIS — G5600 Carpal tunnel syndrome, unspecified upper limb: Secondary | ICD-10-CM | POA: Diagnosis not present

## 2016-01-14 DIAGNOSIS — R252 Cramp and spasm: Secondary | ICD-10-CM | POA: Diagnosis not present

## 2016-01-18 ENCOUNTER — Emergency Department (HOSPITAL_COMMUNITY): Payer: Medicare Other

## 2016-01-18 ENCOUNTER — Encounter (HOSPITAL_COMMUNITY): Payer: Self-pay | Admitting: Emergency Medicine

## 2016-01-18 ENCOUNTER — Emergency Department (HOSPITAL_COMMUNITY)
Admission: EM | Admit: 2016-01-18 | Discharge: 2016-01-18 | Disposition: A | Discharge status: Home / Self Care | Payer: Medicare Other | Attending: Physician Assistant | Admitting: Physician Assistant

## 2016-01-18 DIAGNOSIS — R5383 Other fatigue: Secondary | ICD-10-CM | POA: Diagnosis present

## 2016-01-18 DIAGNOSIS — I10 Essential (primary) hypertension: Secondary | ICD-10-CM | POA: Diagnosis not present

## 2016-01-18 DIAGNOSIS — E039 Hypothyroidism, unspecified: Secondary | ICD-10-CM | POA: Insufficient documentation

## 2016-01-18 DIAGNOSIS — N390 Urinary tract infection, site not specified: Secondary | ICD-10-CM | POA: Insufficient documentation

## 2016-01-18 DIAGNOSIS — R51 Headache: Secondary | ICD-10-CM | POA: Diagnosis not present

## 2016-01-18 DIAGNOSIS — R079 Chest pain, unspecified: Secondary | ICD-10-CM | POA: Diagnosis not present

## 2016-01-18 HISTORY — DX: Carpal tunnel syndrome, unspecified upper limb: G56.00

## 2016-01-18 HISTORY — DX: Fibromyalgia: M79.7

## 2016-01-18 LAB — CBC WITH DIFFERENTIAL/PLATELET
BASOS ABS: 0 10*3/uL (ref 0.0–0.1)
BASOS PCT: 0 %
EOS ABS: 0.2 10*3/uL (ref 0.0–0.7)
Eosinophils Relative: 2 %
HEMATOCRIT: 31 % — AB (ref 36.0–46.0)
HEMOGLOBIN: 10 g/dL — AB (ref 12.0–15.0)
Lymphocytes Relative: 16 %
Lymphs Abs: 1.4 10*3/uL (ref 0.7–4.0)
MCH: 25.8 pg — ABNORMAL LOW (ref 26.0–34.0)
MCHC: 32.3 g/dL (ref 30.0–36.0)
MCV: 79.9 fL (ref 78.0–100.0)
MONOS PCT: 9 %
Monocytes Absolute: 0.8 10*3/uL (ref 0.1–1.0)
NEUTROS ABS: 6.4 10*3/uL (ref 1.7–7.7)
NEUTROS PCT: 73 %
Platelets: 337 10*3/uL (ref 150–400)
RBC: 3.88 MIL/uL (ref 3.87–5.11)
RDW: 14.4 % (ref 11.5–15.5)
WBC: 8.8 10*3/uL (ref 4.0–10.5)

## 2016-01-18 LAB — COMPREHENSIVE METABOLIC PANEL
ALK PHOS: 101 U/L (ref 38–126)
ALT: 18 U/L (ref 14–54)
ANION GAP: 12 (ref 5–15)
AST: 18 U/L (ref 15–41)
Albumin: 3.1 g/dL — ABNORMAL LOW (ref 3.5–5.0)
BILIRUBIN TOTAL: 0.7 mg/dL (ref 0.3–1.2)
BUN: 14 mg/dL (ref 6–20)
CALCIUM: 8.9 mg/dL (ref 8.9–10.3)
CO2: 26 mmol/L (ref 22–32)
CREATININE: 0.81 mg/dL (ref 0.44–1.00)
Chloride: 104 mmol/L (ref 101–111)
Glucose, Bld: 93 mg/dL (ref 65–99)
Potassium: 3.9 mmol/L (ref 3.5–5.1)
Sodium: 142 mmol/L (ref 135–145)
TOTAL PROTEIN: 6.5 g/dL (ref 6.5–8.1)

## 2016-01-18 LAB — URINALYSIS, ROUTINE W REFLEX MICROSCOPIC
Bilirubin Urine: NEGATIVE
Glucose, UA: NEGATIVE mg/dL
HGB URINE DIPSTICK: NEGATIVE
Ketones, ur: NEGATIVE mg/dL
Leukocytes, UA: NEGATIVE
NITRITE: POSITIVE — AB
Protein, ur: NEGATIVE mg/dL
SPECIFIC GRAVITY, URINE: 1.014 (ref 1.005–1.030)
pH: 6.5 (ref 5.0–8.0)

## 2016-01-18 LAB — T4, FREE: FREE T4: 1.04 ng/dL (ref 0.61–1.12)

## 2016-01-18 LAB — TSH: TSH: 0.054 u[IU]/mL — AB (ref 0.350–4.500)

## 2016-01-18 LAB — URINE MICROSCOPIC-ADD ON

## 2016-01-18 MED ORDER — ACETAMINOPHEN 325 MG PO TABS
650.0000 mg | ORAL_TABLET | Freq: Once | ORAL | Status: AC
  Administered 2016-01-18: 650 mg via ORAL
  Filled 2016-01-18: qty 2

## 2016-01-18 MED ORDER — CEPHALEXIN 500 MG PO CAPS
500.0000 mg | ORAL_CAPSULE | Freq: Once | ORAL | Status: AC
  Administered 2016-01-18: 500 mg via ORAL
  Filled 2016-01-18: qty 1

## 2016-01-18 MED ORDER — CEPHALEXIN 500 MG PO CAPS: 500.0000 mg | ORAL_CAPSULE | Freq: Four times a day (QID) | ORAL | 0 refills | Status: DC

## 2016-01-18 NOTE — ED Triage Notes (Signed)
Pt reports she began to have a R side HA that started at 0100 last night. Has also had generalized body pain for the past several days.

## 2016-01-18 NOTE — ED Notes (Signed)
Patient transported to X-ray 

## 2016-01-18 NOTE — ED Notes (Signed)
Pt given turkey sandwich and sprite.  

## 2016-01-18 NOTE — ED Notes (Signed)
ED Provider at bedside. 

## 2016-01-18 NOTE — ED Provider Notes (Addendum)
[facility] DEPT Provider Note   CSN: [id number] Arrival date & time: 01/18/16  0706     History   Chief Complaint Chief Complaint  Patient presents with  . Headache  . Generalized Body Aches    HPI Kerry Garcia is a 82 y.o. female.  HPI   Patient is an 82 year old female presenting with pain all over. Patient reports this been going on for the last year. She felt like she has pain all over her body. Patient also noted that she's had new pain in her left wrist is been being worked up for carpal tunnel for this. Patient said she had a little headache in the right hand side of her head this morning. She was worried because if her son went to church without her that she would get more weak, so she came here to the emergency department to be checked out. Patient had no fever nausea vomiting diarrhea cough shortness of breath or any other symptoms except pain all over.  Past Medical History:  Diagnosis Date  . Carpal tunnel syndrome   . Depression   . Fibromyalgia   . Hypertension   . Hypothyroidism   . Incontinence     Patient Active Problem List   Diagnosis Date Noted  . Hypertension   . Incontinence   . Multinodular goiter   . Depression   . CLAUDICATION 12/02/2009  . CHEST PAIN 12/02/2009    Past Surgical History:  Procedure Laterality Date  . BREAST LUMPECTOMY  1965  . SPINE SURGERY      OB History    Gravida Para Term Preterm AB Living   6 5     1 4    SAB TAB Ectopic Multiple Live Births                   Home Medications    Prior to Admission medications   Medication Sig Start Date End Date Taking? Authorizing Provider  Besifloxacin HCl (BESIVANCE) 0.6 % SUSP INSTILL ONE DROP INTO RIGHT EYE 4 TIMES A DAY FOR 2 DAYS AFTER EACH MONTHLY EYE INJECTION 12/18/15  Yes Historical Provider, MD  Bevacizumab (AVASTIN IV) Injection of the eye   Yes Historical Provider, MD  celecoxib (CELEBREX) 200 MG capsule Take 200 mg by mouth daily. with food 12/04/15   Yes Historical Provider, MD  citalopram (CELEXA) 10 MG tablet Take 10 mg by mouth daily. 09/02/14  Yes Historical Provider, MD  COMBIGAN 0.2-0.5 % ophthalmic solution INSTILL 1 DROP INTO RIGHT EYE TWICE A DAY 05/13/15  Yes Historical Provider, MD  DULoxetine (CYMBALTA) 30 MG capsule Take 30 mg by mouth daily.     Yes Historical Provider, MD  meclizine (ANTIVERT) 12.5 MG tablet Take 12.5 mg by mouth 3 (three) times daily as needed for dizziness.  09/29/15  Yes Historical Provider, MD  triamterene-hydrochlorothiazide (MAXZIDE-25) 37.5-25 MG tablet TAKE 1 TABLET BY MOUTH IN THE MORNING 12/25/15  Yes Historical Provider, MD  cephALEXin (KEFLEX) 500 MG capsule Take 1 capsule (500 mg total) by mouth 4 (four) times daily. 01/18/16   Mehul Rudin Lyn Rambo Sarafian, MD    Family History Family History  Problem Relation Age of Onset  . Heart failure Father   . Prostate cancer Son     Social History Social History  Substance Use Topics  . Smoking status: Never Smoker  . Smokeless tobacco: Never Used  . Alcohol use No     Allergies   Ibuprofen and Fish allergy   Review of Systems  Review of Systems  Constitutional: Positive for fatigue. Negative for fever.  Respiratory: Negative for cough and shortness of breath.   Cardiovascular: Negative for chest pain and palpitations.  Gastrointestinal: Negative for abdominal pain.  Genitourinary: Negative for dysuria and frequency.       Maldorous urine  Musculoskeletal: Positive for arthralgias and myalgias.  Neurological: Negative for dizziness.  All other systems reviewed and are negative.    Physical Exam Updated Vital Signs BP 137/61   Pulse 80   Temp 98.4 F (36.9 C) (Oral)   Resp 19   LMP 03/16/1963   SpO2 99%   Physical Exam  Constitutional: She is oriented to person, place, and time. She appears well-developed and well-nourished.  HENT:  Head: Normocephalic and atraumatic.  proptosis  Eyes: Conjunctivae are normal. Right eye exhibits no  discharge.  Neck: Neck supple.  Cardiovascular: Normal rate, regular rhythm and normal heart sounds.   No murmur heard. Pulmonary/Chest: Effort normal and breath sounds normal. She has no wheezes. She has no rales.  Abdominal: Soft. She exhibits no distension. There is no tenderness.  Musculoskeletal: Normal range of motion. She exhibits no edema.  Neurological: She is oriented to person, place, and time. No cranial nerve deficit.  Equal strength bilaterally upper and lower extremities negative pronator drift. Normal sensation bilaterally. Speech comprehensible, no slurring. Facial nerve tested and appears grossly normal. Alert and oriented 3.   Skin: Skin is warm and dry. No rash noted. She is not diaphoretic.  Psychiatric: She has a normal mood and affect. Her behavior is normal.  Nursing note and vitals reviewed.    ED Treatments / Results  Labs (all labs ordered are listed, but only abnormal results are displayed) Labs Reviewed  COMPREHENSIVE METABOLIC PANEL - Abnormal; Notable for the following:       Result Value   Albumin 3.1 (*)    All other components within normal limits  CBC WITH DIFFERENTIAL/PLATELET - Abnormal; Notable for the following:    Hemoglobin 10.0 (*)    HCT 31.0 (*)    MCH 25.8 (*)    All other components within normal limits  URINALYSIS, ROUTINE W REFLEX MICROSCOPIC (NOT AT [facility]) - Abnormal; Notable for the following:    APPearance HAZY (*)    Nitrite POSITIVE (*)    All other components within normal limits  TSH - Abnormal; Notable for the following:    TSH 0.054 (*)    All other components within normal limits  URINE MICROSCOPIC-ADD ON - Abnormal; Notable for the following:    Squamous Epithelial / LPF 0-5 (*)    Bacteria, UA MANY (*)    All other components within normal limits  URINE CULTURE  T4  T4, FREE    EKG  EKG Interpretation  Date/Time:  Sunday January 18 2016 08:08:46 EST Ventricular Rate:  81 PR Interval:    QRS  Duration: 83 QT Interval:  373 QTC Calculation: 433 R Axis:   89 Text Interpretation:  Sinus rhythm Borderline right axis deviation Nonspecific T abnrm, anterolateral leads No significant change since last tracing Confirmed by Gerald Leitz ([id number]) on 01/18/2016 10:18:36 AM       Radiology Dg Chest 2 View  Result Date: 01/18/2016 CLINICAL DATA:  Chest pain radiating to the right ear. EXAM: CHEST  2 VIEW COMPARISON:  05/24/2010 FINDINGS: Right peritracheal mass correlates with the thyroid goiter by CT resulting in leftward tracheal deviation. Calcified granulomas in the left upper lobe and left hilum as before. Stable  mild cardiomegaly without CHF, focal pneumonia, collapse or consolidation. No effusion or pneumothorax. S shaped scoliosis of the spine. Degenerative changes of the spine and shoulders also evident. IMPRESSION: Stable chest exam without superimposed acute process. 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 Electronically Signed   By: Jerilynn Mages.  Shick M.D.   On: 01/18/2016 08:21   Ct Head Wo Contrast  Result Date: 01/18/2016 CLINICAL DATA:  RIGHT-side headache beginning last night at 0100 hours, generalized body pain for several days, history hypertension, fibromyalgia EXAM: CT HEAD WITHOUT CONTRAST TECHNIQUE: Contiguous axial images were obtained from the base of the skull through the vertex without intravenous contrast. COMPARISON:  07/21/2010 FINDINGS: Brain: Normal ventricular morphology. No midline shift or mass effect. Small vessel chronic ischemic changes of deep cerebral white matter. No intracranial hemorrhage, mass lesion, evidence acute infarction, or extra-axial fluid collection. Low position of cerebellar tonsils consistent with Chiari malformation. Vascular: Normal appearance Skull: Prior suboccipital decompression of the foramen magnum. Mild hyperostosis frontalis interna. Skull intact. Sinuses/Orbits:  Question prior RIGHT mastoidectomy. Visualized paranasal sinuses clear. Other: Dense calcification of the falx IMPRESSION: Atrophy with small vessel chronic ischemic changes of deep cerebral white matter. Prior suboccipital decompression of the foramen magnum for Chiari malformation. No acute intracranial abnormalities. Electronically Signed   By: Lavonia Dana M.D.   On: 01/18/2016 08:43    Procedures Procedures (including critical care time)  Medications Ordered in ED Medications  cephALEXin (KEFLEX) capsule 500 mg (500 mg Oral Given 01/18/16 1337)  acetaminophen (TYLENOL) tablet 650 mg (650 mg Oral Given 01/18/16 1337)     Initial Impression / Assessment and Plan / ED Course  I have reviewed the triage vital signs and the nursing notes.  Pertinent labs & imaging results that were available during my care of the patient were reviewed by me and considered in my medical decision making (see chart for details).  Clinical Course    Patient is an 82 year old female presenting with pain all over. Patient's got relatively negative review of systems except Malodorous urine. Patient denies any fever or concerning symptoms. This is been going on for the last year according to patient and had a little bit worse this morning. Additionally she reports new headache. She says that she was recently talking to her surgeon about her carpal tunnel and she remembered that she had a surgery done on her brain. She was concerned that this headache today may be secondary to that.  We will do screening labs, EKG, CT of head and do a urine. Patient has got normal physical exam and vital signs at this time.   Goiter noted on CXR.  Brought it up with son, he has no knowledge of thyroid issues but in reviewing notes, has had diagnosis of multinodrular goiter and measurentmetn of TSH in past. Will have her follow up with PCP this week for further care of this issue.   2:29 PM TSH low.  Will send T4.  Last note from 2014  that she was havig her thyroid monitored as multinodular thyroid.   2:29 PM Discussed patient's thyroid level with her primary care physician's coverage, Dr. Inda Merlin. They will follow up in clinic this week. We'll treat patient's urinary tract infection in the meantime. Patient shows no signs of thyroid storm. Normal vital signs.  Patient is comfortable, ambulatory, and taking PO at time of discharge.  Patient expressed understanding about return precautions.     Final Clinical Impressions(s) / ED  Diagnoses   Final diagnoses:  Urinary tract infection without hematuria, site unspecified    New Prescriptions New Prescriptions   CEPHALEXIN (KEFLEX) 500 MG CAPSULE    Take 1 capsule (500 mg total) by mouth 4 (four) times daily.     Ruie Sendejo Julio Alm, MD 01/18/16 [place], MD 01/18/16 1429

## 2016-01-18 NOTE — ED Notes (Signed)
Pt ambulated to bathroom with no assitance 

## 2016-01-18 NOTE — Discharge Instructions (Signed)
You were seen today with pain all over. You' were found to have a urinary tract infection. In addition you are found to have a thyroid abnormality. We discussed this with her primary care physician's coverage and they will see you this week to make changes to your medication. Please return with any concerns.

## 2016-01-18 NOTE — ED Notes (Signed)
Patient transported to CT 

## 2016-01-19 DIAGNOSIS — N390 Urinary tract infection, site not specified: Secondary | ICD-10-CM | POA: Diagnosis not present

## 2016-01-19 DIAGNOSIS — E042 Nontoxic multinodular goiter: Secondary | ICD-10-CM | POA: Diagnosis not present

## 2016-01-20 LAB — T4: T4 TOTAL: 5.8 ug/dL (ref 4.5–12.0)

## 2016-01-20 LAB — URINE CULTURE: Culture: >=100000 — AB

## 2016-01-21 ENCOUNTER — Telehealth (HOSPITAL_COMMUNITY): Payer: Self-pay

## 2016-01-21 NOTE — Telephone Encounter (Signed)
Post ED Visit - Positive Culture Follow-up  Culture report reviewed by antimicrobial stewardship pharmacist:  []  Elenor Quinones, Pharm.D. []  Heide Guile, Pharm.D., BCPS []  Parks Neptune, Pharm.D. []  Alycia Rossetti, Pharm.D., BCPS []  Bovina, Pharm.D., BCPS, AAHIVP []  Legrand Como, Pharm.D., BCPS, AAHIVP []  Cassie Stewart, Pharm.D. []  Stephens November, Pharm.D. Celene Skeen, Pharm.D.  Positive urine culture,>/= 100,000 colonies -> E Coli Treated with Cephalexin, organism sensitive to the same and no further patient follow-up is required at this time.  [name] 01/21/2016, 8:51 AM

## 2016-01-30 DIAGNOSIS — R531 Weakness: Secondary | ICD-10-CM | POA: Diagnosis not present

## 2016-01-30 DIAGNOSIS — E042 Nontoxic multinodular goiter: Secondary | ICD-10-CM | POA: Diagnosis not present

## 2016-02-02 DIAGNOSIS — N390 Urinary tract infection, site not specified: Secondary | ICD-10-CM | POA: Diagnosis not present

## 2016-02-06 ENCOUNTER — Emergency Department (HOSPITAL_COMMUNITY): Payer: Medicare Other

## 2016-02-06 ENCOUNTER — Encounter (HOSPITAL_COMMUNITY): Payer: Self-pay | Admitting: Emergency Medicine

## 2016-02-06 ENCOUNTER — Emergency Department (HOSPITAL_COMMUNITY)
Admission: EM | Admit: 2016-02-06 | Discharge: 2016-02-06 | Disposition: A | Discharge status: Home / Self Care | Payer: Medicare Other | Attending: Emergency Medicine | Admitting: Emergency Medicine

## 2016-02-06 DIAGNOSIS — M4807 Spinal stenosis, lumbosacral region: Secondary | ICD-10-CM | POA: Diagnosis not present

## 2016-02-06 DIAGNOSIS — I10 Essential (primary) hypertension: Secondary | ICD-10-CM | POA: Diagnosis not present

## 2016-02-06 DIAGNOSIS — M25551 Pain in right hip: Secondary | ICD-10-CM | POA: Diagnosis present

## 2016-02-06 DIAGNOSIS — Z79899 Other long term (current) drug therapy: Secondary | ICD-10-CM | POA: Diagnosis not present

## 2016-02-06 DIAGNOSIS — M48061 Spinal stenosis, lumbar region without neurogenic claudication: Secondary | ICD-10-CM | POA: Diagnosis not present

## 2016-02-06 DIAGNOSIS — E039 Hypothyroidism, unspecified: Secondary | ICD-10-CM | POA: Insufficient documentation

## 2016-02-06 DIAGNOSIS — M4800 Spinal stenosis, site unspecified: Secondary | ICD-10-CM

## 2016-02-06 LAB — BASIC METABOLIC PANEL
Anion gap: 10 (ref 5–15)
BUN: 17 mg/dL (ref 6–20)
CALCIUM: 9 mg/dL (ref 8.9–10.3)
CHLORIDE: 104 mmol/L (ref 101–111)
CO2: 26 mmol/L (ref 22–32)
CREATININE: 0.97 mg/dL (ref 0.44–1.00)
GFR calc non Af Amer: 51 mL/min — ABNORMAL LOW (ref >60)
GFR, EST AFRICAN AMERICAN: 59 mL/min — AB (ref >60)
Glucose, Bld: 175 mg/dL — ABNORMAL HIGH (ref 65–99)
Potassium: 3.7 mmol/L (ref 3.5–5.1)
SODIUM: 140 mmol/L (ref 135–145)

## 2016-02-06 LAB — CBG MONITORING, ED: GLUCOSE-CAPILLARY: 89 mg/dL (ref 65–99)

## 2016-02-06 LAB — URINALYSIS, ROUTINE W REFLEX MICROSCOPIC
Bilirubin Urine: NEGATIVE
Glucose, UA: NEGATIVE mg/dL
Hgb urine dipstick: NEGATIVE
Ketones, ur: NEGATIVE mg/dL
Nitrite: NEGATIVE
PROTEIN: NEGATIVE mg/dL
Specific Gravity, Urine: 1.017 (ref 1.005–1.030)
pH: 6 (ref 5.0–8.0)

## 2016-02-06 LAB — URINE MICROSCOPIC-ADD ON

## 2016-02-06 LAB — CBC
HCT: 31.2 % — ABNORMAL LOW (ref 36.0–46.0)
Hemoglobin: 9.9 g/dL — ABNORMAL LOW (ref 12.0–15.0)
MCH: 25.3 pg — ABNORMAL LOW (ref 26.0–34.0)
MCHC: 31.7 g/dL (ref 30.0–36.0)
MCV: 79.8 fL (ref 78.0–100.0)
PLATELETS: 471 10*3/uL — AB (ref 150–400)
RBC: 3.91 MIL/uL (ref 3.87–5.11)
RDW: 15 % (ref 11.5–15.5)
WBC: 10.5 10*3/uL (ref 4.0–10.5)

## 2016-02-06 LAB — SEDIMENTATION RATE: SED RATE: 74 mm/h — AB (ref 0–22)

## 2016-02-06 MED ORDER — DOCUSATE SODIUM 100 MG PO CAPS: 100.0000 mg | ORAL_CAPSULE | Freq: Two times a day (BID) | ORAL | 0 refills | Status: DC

## 2016-02-06 MED ORDER — POLYETHYLENE GLYCOL 3350 17 G PO PACK: 17.0000 g | PACK | Freq: Every day | ORAL | 0 refills | Status: DC

## 2016-02-06 MED ORDER — OXYCODONE-ACETAMINOPHEN 5-325 MG PO TABS: 1.0000 | ORAL_TABLET | ORAL | 0 refills | Status: DC | PRN

## 2016-02-06 MED ORDER — OXYCODONE-ACETAMINOPHEN 5-325 MG PO TABS
1.0000 | ORAL_TABLET | Freq: Once | ORAL | Status: AC
  Administered 2016-02-06: 1 via ORAL
  Filled 2016-02-06: qty 1

## 2016-02-06 NOTE — ED Notes (Signed)
Wants soup . Reminder to give during dc

## 2016-02-06 NOTE — ED Provider Notes (Signed)
[name] DEPT Provider Note   CSN: [id number] Arrival date & time: 02/06/16  1225     History   Chief Complaint Chief Complaint  Patient presents with  . Weakness    HPI Kerry Garcia is a 82 y.o. female.  HPI Patient has multiple complaints. She reports she has pains in both her hips, knees and arms. This there severity of this waxes and wanes. There are days when every joint seems to hurt. At other times, she reports she can be sitting there and feel just fine. She reports however she has problems with weakness preventing her from standing and walking with facility. She reports that she has a lot of difficulty trying to stand up out of her chair and needs to get help from her family members. Once up, walking is difficult. She feels generally weak in the lower extremities. She does not have focal weakness or numbness. This has been getting worse over the course of about 2 weeks. Patient also reports that she gets episodes of being very cold. She however does not have fever, nausea or vomiting. No abdominal pain. No cough or sputum production. No chest pain. Past Medical History:  Diagnosis Date  . Carpal tunnel syndrome   . Depression   . Fibromyalgia   . Hypertension   . Hypothyroidism   . Incontinence     Patient Active Problem List   Diagnosis Date Noted  . Hypertension   . Incontinence   . Multinodular goiter   . Depression   . CLAUDICATION 12/02/2009  . CHEST PAIN 12/02/2009    Past Surgical History:  Procedure Laterality Date  . BREAST LUMPECTOMY  1965  . SPINE SURGERY      OB History    Gravida Para Term Preterm AB Living   6 5     1 4    SAB TAB Ectopic Multiple Live Births                   Home Medications    Prior to Admission medications   Medication Sig Start Date End Date Taking? Authorizing Provider  Acetaminophen (TYLENOL ARTHRITIS PAIN PO) Take 1 tablet by mouth every 6 (six) hours.   Yes Historical Provider, MD  Bevacizumab  (AVASTIN IV) Injection of the eye   Yes Historical Provider, MD  celecoxib (CELEBREX) 200 MG capsule Take 200 mg by mouth daily. with food 12/04/15  Yes Historical Provider, MD  ciprofloxacin (CIPRO) 250 MG tablet Take 250 mg by mouth every 12 (twelve) hours. X 7 days, started 11/20 02/02/16  Yes Historical Provider, MD  COMBIGAN 0.2-0.5 % ophthalmic solution INSTILL 1 DROP INTO RIGHT EYE TWICE A DAY 05/13/15  Yes Historical Provider, MD  DULoxetine (CYMBALTA) 30 MG capsule Take 30 mg by mouth daily.     Yes Historical Provider, MD  Multiple Vitamin (MULTIVITAMIN WITH MINERALS) TABS tablet Take 1 tablet by mouth daily.   Yes Historical Provider, MD  triamterene-hydrochlorothiazide (MAXZIDE-25) 37.5-25 MG tablet TAKE 1 TABLET BY MOUTH IN THE MORNING 12/25/15  Yes Historical Provider, MD  Besifloxacin HCl (BESIVANCE) 0.6 % SUSP INSTILL ONE DROP INTO RIGHT EYE 4 TIMES A DAY FOR 2 DAYS AFTER EACH MONTHLY EYE INJECTION 12/18/15   Historical Provider, MD  cephALEXin (KEFLEX) 500 MG capsule Take 1 capsule (500 mg total) by mouth 4 (four) times daily. Patient not taking: Reported on 02/06/2016 01/18/16   Courteney Lyn Mackuen, MD  docusate sodium (COLACE) 100 MG capsule Take 1 capsule (100 mg total)  by mouth every 12 (twelve) hours. 02/06/16   [name], MD  oxyCODONE-acetaminophen (PERCOCET) 5-325 MG tablet Take 1-2 tablets by mouth every 4 (four) hours as needed. 02/06/16   [name], MD  polyethylene glycol (MIRALAX / GLYCOLAX) packet Take 17 g by mouth daily. Take every third day if no bowel movement while taking narcotics. 02/06/16   [name], MD    Family History Family History  Problem Relation Age of Onset  . Heart failure Father   . Prostate cancer Son     Social History Social History  Substance Use Topics  . Smoking status: Never Smoker  . Smokeless tobacco: Never Used  . Alcohol use No     Allergies   Ibuprofen and Fish allergy   Review of Systems Review of  Systems 10 Systems reviewed and are negative for acute change except as noted in the HPI.  Physical Exam Updated Vital Signs BP 163/62 (BP Location: Right Arm)   Pulse 75   Temp 98.4 F (36.9 C)   Resp 18   Ht 5\' 3"  (1.6 m)   Wt 199 lb (90.3 kg)   LMP 03/16/1963   SpO2 98%   BMI 35.25 kg/m   Physical Exam  Constitutional: She is oriented to person, place, and time.  Patient is alert and nontoxic. Mental status is clear. No respiratory distress.  HENT:  Head: Normocephalic and atraumatic.  Right Ear: External ear normal.  Left Ear: External ear normal.  Nose: Nose normal.  Mouth/Throat: Oropharynx is clear and moist.  Eyes: Conjunctivae and EOM are normal. Pupils are equal, round, and reactive to light.  Neck: Neck supple. No tracheal deviation present. No thyromegaly present.  Cardiovascular: Normal rate, regular rhythm, normal heart sounds and intact distal pulses.   Pulmonary/Chest: Effort normal and breath sounds normal.  Abdominal: Soft. Bowel sounds are normal. She exhibits no distension. There is no tenderness.  Musculoskeletal:  Patient has moderate arthritic changes of the wrists and metacarpals. Also general arthritic enlargement of both knees. None of her joints have erythema or warmth to them. No deformities. No effusions of the knees. Calves are soft and nontender. Soft tissues of the extremities are normal except her deconditioning.  Lymphadenopathy:    She has no cervical adenopathy.  Neurological: She is alert and oriented to person, place, and time. No cranial nerve deficit. She exhibits normal muscle tone. Coordination normal.  Patient can elevate each lower extremity off of the bed independently and hold it. She does perceive this to be slightly more difficult on the left. Patient can flex at the knees. She does have some range of motion limitation due to fairly severe knee arthritis. She however has good resistance against forced extension of the leg. Patient  with 5\5 plantar and dorsiflexion. Grip strengths are 4\5 bilaterally but this is due to general hand weakness and carpal tunnel per the patient  Skin: Skin is warm and dry.  Psychiatric: She has a normal mood and affect.     ED Treatments / Results  Labs (all labs ordered are listed, but only abnormal results are displayed) Labs Reviewed  BASIC METABOLIC PANEL - Abnormal; Notable for the following:       Result Value   Glucose, Bld 175 (*)    GFR calc non Af Amer 51 (*)    GFR calc Af Amer 59 (*)    All other components within normal limits  CBC - Abnormal; Notable for the following:    Hemoglobin 9.9 (*)  HCT 31.2 (*)    MCH 25.3 (*)    Platelets 471 (*)    All other components within normal limits  URINALYSIS, ROUTINE W REFLEX MICROSCOPIC (NOT AT [facility]) - Abnormal; Notable for the following:    APPearance CLOUDY (*)    Leukocytes, UA SMALL (*)    All other components within normal limits  SEDIMENTATION RATE - Abnormal; Notable for the following:    Sed Rate 74 (*)    All other components within normal limits  URINE MICROSCOPIC-ADD ON - Abnormal; Notable for the following:    Squamous Epithelial / LPF 6-30 (*)    Bacteria, UA FEW (*)    All other components within normal limits  CBG MONITORING, ED    EKG  EKG Interpretation  Date/Time:  Friday February 06 2016 13:14:04 EST Ventricular Rate:  89 PR Interval:    QRS Duration: 92 QT Interval:  362 QTC Calculation: 441 R Axis:   85 Text Interpretation:  Sinus rhythm Borderline right axis deviation Baseline wander in lead(s) V1 since last tracing no significant change Confirmed by Eulis Foster  MD, Vira Agar [phone]) on 02/06/2016 1:19:04 PM       Radiology Mr Lumbar Spine Wo Contrast  Result Date: 02/06/2016 CLINICAL DATA:  82 year old female with generalized weakness and difficulty walking for several weeks, progressive for 7 days. Lower extremity weakness. Initial encounter. EXAM: MRI LUMBAR SPINE WITHOUT CONTRAST  TECHNIQUE: Multiplanar, multisequence MR imaging of the lumbar spine was performed. No intravenous contrast was administered. COMPARISON:  Lumbar MRI 07/09/2004. FINDINGS: Segmentation: Lumbar segmentation appears to be normal and will be designated as such for this report, which is the same numbering system used in 2006. Alignment: Grade 1 anterolisthesis of L3 on L4 is new since 2006, measuring 4-5 mm. Otherwise stable and normal vertebral height and alignment. Vertebrae: No marrow edema or evidence of acute osseous abnormality. Visible sacrum and SI joints intact. Conus medullaris: Extends to the T12-L1 level and appears normal. Paraspinal and other soft tissues: Evidence of a 3 cm gallstone in the neck of the gallbladder on series 9, image 8. Otherwise negative visualized abdominal viscera (chronic benign appearing renal cysts). Negative visualized pelvic viscera. Negative visualized posterior paraspinal soft tissues. Disc levels: T11-T12:  Mild facet hypertrophy. T12-L1:  Mild to moderate facet hypertrophy. L1-L2: Mild circumferential disc bulge. Mild to moderate facet and ligament flavum hypertrophy. New facet joint fluid. Mild left L1 foraminal stenosis. L2-L3: Increased and now right eccentric circumferential disc bulge with endplate spurring. Moderate facet and ligament flavum hypertrophy. New right facet joint fluid. New mild spinal stenosis and mild to moderate right lateral recess stenosis. Increased right greater than left mild L2 foraminal stenosis. L3-L4: Severe disc space loss and grade 1 anterolisthesis since 2006. Bulky right eccentric circumferential disc osteophyte complex. Broad-based posterior component. Progressed and severe facet hypertrophy worse on the right. Progressed moderate ligament flavum hypertrophy. Increased and severe spinal and right greater than left lateral recess stenosis. Increased moderate right L3 foraminal stenosis. L4-L5: Increased left eccentric mostly far lateral disc  osteophyte complex. Broad-based posterior component. Progressed moderate facet and severe ligament flavum hypertrophy. Chronic facet joint fluid. Progressed moderate to severe spinal and left lateral recess stenosis. New mild to moderate right lateral recess stenosis. Progressed moderate to severe left L4 foraminal stenosis. L5-S1: Progressed right eccentric and far lateral disc osteophyte complex. Broad-based posterior component. Chronic moderate facet hypertrophy appears not significantly changed along with mild ligament flavum hypertrophy. Mild to moderate bilateral lateral recess stenosis has  increased and is greater on the right. Borderline to mild spinal stenosis is new. No foraminal stenosis. IMPRESSION: 1. No acute osseous abnormality identified. Development of grade 1 spondylolisthesis at L3-L4 since 2006. 2. Progressed lumbar spine degeneration since 2006. Increased and now severe multifactorial spinal and right greater than left lateral recess stenosis at L3-L4. Increased and now moderate to severe spinal, left lateral recess, and foraminal stenosis at L4-L5. New mild spinal stenosis at L2-L3 and L5-S1. 3. Three cm gallstone suspected in the gallbladder. Electronically Signed   By: Genevie Ann M.D.   On: 02/06/2016 19:03    Procedures Procedures (including critical care time)  Medications Ordered in ED Medications  oxyCODONE-acetaminophen (PERCOCET/ROXICET) 5-325 MG per tablet 1 tablet (not administered)     Initial Impression / Assessment and Plan / ED Course  I have reviewed the triage vital signs and the nursing notes.  Pertinent labs & imaging results that were available during my care of the patient were reviewed by me and considered in my medical decision making (see chart for details).  Clinical Course      Final Clinical Impressions(s) / ED Diagnoses   Final diagnoses:  Spinal stenosis, unspecified spinal region   Patient has been developing increasing generalized arthritic  pain. In conjunction with that she's had increasing problems with lower extremity weakness and gait dysfunction. MRI shows multiple levels of degenerative disease and also a stenosis. On musculoskeletal exam, patient has intact strength to extension and flexion. At this time, plan will be to initiate pain control with as needed Percocet. Patient is counseled on the need to take stool softeners to avoid constipation. She is instructed to follow-up with both her family provider and neurosurgery. New Prescriptions New Prescriptions   DOCUSATE SODIUM (COLACE) 100 MG CAPSULE    Take 1 capsule (100 mg total) by mouth every 12 (twelve) hours.   OXYCODONE-ACETAMINOPHEN (PERCOCET) 5-325 MG TABLET    Take 1-2 tablets by mouth every 4 (four) hours as needed.   POLYETHYLENE GLYCOL (MIRALAX / GLYCOLAX) PACKET    Take 17 g by mouth daily. Take every third day if no bowel movement while taking narcotics.     [name], MD 02/06/16 2003

## 2016-02-06 NOTE — ED Notes (Signed)
Patient transported to MRI 

## 2016-02-06 NOTE — ED Triage Notes (Signed)
Pt presents with son, who lives with her. States pt has been having difficulty walking and generalized weakness for a few weeks but has worsened these past 7 days. Pt c/o trouble getting up from a sitting position and swelling in her fingers. Pt states she will not be able to get up and will slide out of her bed and call for help.

## 2016-02-10 ENCOUNTER — Emergency Department (HOSPITAL_COMMUNITY)
Admission: EM | Admit: 2016-02-10 | Discharge: 2016-02-10 | Disposition: A | Discharge status: Home / Self Care | Payer: Medicare Other | Attending: Emergency Medicine | Admitting: Emergency Medicine

## 2016-02-10 ENCOUNTER — Encounter (HOSPITAL_COMMUNITY): Payer: Self-pay | Admitting: Emergency Medicine

## 2016-02-10 DIAGNOSIS — M19031 Primary osteoarthritis, right wrist: Secondary | ICD-10-CM | POA: Diagnosis not present

## 2016-02-10 DIAGNOSIS — R29898 Other symptoms and signs involving the musculoskeletal system: Secondary | ICD-10-CM

## 2016-02-10 DIAGNOSIS — R52 Pain, unspecified: Secondary | ICD-10-CM | POA: Diagnosis present

## 2016-02-10 DIAGNOSIS — M19032 Primary osteoarthritis, left wrist: Secondary | ICD-10-CM | POA: Diagnosis not present

## 2016-02-10 DIAGNOSIS — M1990 Unspecified osteoarthritis, unspecified site: Secondary | ICD-10-CM | POA: Insufficient documentation

## 2016-02-10 DIAGNOSIS — M6281 Muscle weakness (generalized): Secondary | ICD-10-CM | POA: Diagnosis not present

## 2016-02-10 DIAGNOSIS — I10 Essential (primary) hypertension: Secondary | ICD-10-CM | POA: Diagnosis not present

## 2016-02-10 DIAGNOSIS — E039 Hypothyroidism, unspecified: Secondary | ICD-10-CM | POA: Insufficient documentation

## 2016-02-10 DIAGNOSIS — R531 Weakness: Secondary | ICD-10-CM | POA: Diagnosis not present

## 2016-02-10 DIAGNOSIS — M4800 Spinal stenosis, site unspecified: Secondary | ICD-10-CM | POA: Diagnosis not present

## 2016-02-10 DIAGNOSIS — Z79899 Other long term (current) drug therapy: Secondary | ICD-10-CM | POA: Insufficient documentation

## 2016-02-10 HISTORY — DX: Spinal stenosis, site unspecified: M48.00

## 2016-02-10 LAB — URINALYSIS, ROUTINE W REFLEX MICROSCOPIC
Bilirubin Urine: NEGATIVE
Glucose, UA: NEGATIVE mg/dL
Hgb urine dipstick: NEGATIVE
KETONES UR: NEGATIVE mg/dL
NITRITE: NEGATIVE
PH: 5 (ref 5.0–8.0)
PROTEIN: NEGATIVE mg/dL
Specific Gravity, Urine: 1.023 (ref 1.005–1.030)

## 2016-02-10 LAB — URINE MICROSCOPIC-ADD ON

## 2016-02-10 MED ORDER — CEPHALEXIN 500 MG PO CAPS
500.0000 mg | ORAL_CAPSULE | Freq: Two times a day (BID) | ORAL | 0 refills | Status: DC
Start: 2016-02-10 — End: 2016-03-05

## 2016-02-10 MED ORDER — OXYCODONE-ACETAMINOPHEN 5-325 MG PO TABS
1.0000 | ORAL_TABLET | Freq: Once | ORAL | Status: AC
  Administered 2016-02-10: 1 via ORAL
  Filled 2016-02-10: qty 1

## 2016-02-10 MED ORDER — OXYCODONE-ACETAMINOPHEN 5-325 MG PO TABS: 1.0000 | ORAL_TABLET | ORAL | 0 refills | Status: DC | PRN

## 2016-02-10 NOTE — ED Triage Notes (Signed)
Pt reports generalized joint pain for the last 24 hours. Recent dx of spinal stenosis. Pt has had a runny nose today.

## 2016-02-10 NOTE — Progress Notes (Signed)
ED CM informed of CM consult by Kerry Garcia ED [place]  CM spoke with Kerry Garcia ED PA about reason for consult - states pt with mobility concerns, Hx UTI, interest in home health for pt  Cm spoke with pt, [name] about CM consult Pt and sons agreed to home health PT/RN/aide after CM reviewed in details medicare guidelines, Choices of home health [facility]) (length of stay in home, types of [facility] staff available, coverage, primary caregiver, up to 24 hrs before services may be started) and choices of Private duty nursing (PDN-coverage, length of stay in the home types of staff available). CM provided pt/family with a list of [place] home health agencies and PDN.  Pt confirms "some memory issues" Discussed medicare.gov for possible new pcp after pt voiced being uncomfortable with seeing present pcp, [name], Kerry Garcia voiced not being happy with pt being placed on cipro and oxycodone together (reaction) Discussed need of pcp to assist with continuation of home health services after leaving [facility] ED  Cm answered questions about pcp services related home health and pt present medical care. Redirected to pcp and [facility] ED PA as needed Cm updated ED PA, Kerry Garcia about requests for home health initiation from [facility] ED, UTI treatment and Rx for celebrex until seen by present or new medicare pcp Provided pt and sons with a list of medicare internal medicine providers within zip code [phone] accepting new patients to use for possible future new pcp services  Pending pt, sons choice of home health agency for services Pt with [facility] ED visits x 3 in last 6 months, not admissions

## 2016-02-10 NOTE — ED Notes (Signed)
[facility] arrived to transport pt. 

## 2016-02-10 NOTE — ED Notes (Signed)
Case worker at bedside.

## 2016-02-10 NOTE — ED Notes (Signed)
Bed: [facility] Expected date:  Expected time:  Means of arrival:  Comments: 

## 2016-02-10 NOTE — ED Notes (Signed)
Notified [facility] for transportation back to her residence.

## 2016-02-10 NOTE — Progress Notes (Signed)
Patient suffers from general lower extremity weakness which impairs their ability to perform daily activities like dressing in the home. A walker will not resolve  issue with performing activities of daily living. A wheelchair will allow patient to safely perform daily activities. Patient can safely propel the wheelchair in the home or has a caregiver who can provide assistance.  Accessories: elevating leg rests (ELRs), wheel locks, extensions and anti-tippers.

## 2016-02-10 NOTE — Evaluation (Signed)
Physical Therapy Evaluation Patient Details Name: Kerry Garcia MRN: [id number] DOB: 09-30-1925 Today's Date: 02/10/2016   History of Present Illness  has multiple complaints. She reports she has pains in both her hips, knees and arms. This there severity of this waxes and wanes. There are days when every joint seems to hurt. At other times, she reports she can be sitting there and feel just fine. She reports however she has problems with weakness preventing her from standing and walking   Clinical Impression  The  Patient 's son was helpful in demonstrating how he has been assisting his mother from bed to chair. Has not been ambulatory recently. The patient was able to stand and pivot to [facility] with 1 assist. The patient needs a WC to mobilize at her apartment until back to ambulating. Plans are for Dc to home w/ HHPT, WC and BSC.    Follow Up Recommendations Home health PT    Equipment Recommendations  Wheelchair (measurements PT);3in1 (PT)    Recommendations for Other Services       Precautions / Restrictions Precautions Precautions: Fall      Mobility  Bed Mobility Overal bed mobility: Needs Assistance Bed Mobility: Supine to Sit     Supine to sit: Min assist     General bed mobility comments: extra time to mobilize to sitting  Transfers Overall transfer level: Needs assistance Equipment used: 1 person hand held assist Transfers: Sit to/from [employer] Transfers Sit to Stand: Mod assist Stand pivot transfers: Mod assist       General transfer comment: son present and assisted patient from stretcher to [facility] in the manner that he  assists the patient PTA holding  her arm and assisting to stand up and pivot steps.  Ambulation/Gait                Stairs            Wheelchair Mobility    Modified Rankin (Stroke Patients Only)       Balance                                             Pertinent Vitals/Pain Pain  Assessment: Faces Faces Pain Scale: Hurts little more Pain Location: knees and hands Pain Descriptors / Indicators: Aching Pain Intervention(s): Repositioned;Heat applied    Home Living Family/patient expects to be discharged to:: Private residence Living Arrangements: Alone;Children Available Help at Discharge: Family;Available 24 hours/day Type of Home: Apartment Home Access: Elevator     Home Layout: One level Home Equipment: Walker - 2 wheels      Prior Function Level of Independence: Needs assistance   Gait / Transfers Assistance Needed: has not been able to ambulate for  a few days . patiwnt unable to use RW due to hand dysfunction from CT. She was ambulating short distance with HHA   ADL's / Homemaking Assistance Needed: requires assist of son to get to bathroom        Hand Dominance        Extremity/Trunk Assessment   Upper Extremity Assessment: Generalized weakness           Lower Extremity Assessment: Generalized weakness      Cervical / Trunk Assessment: Normal  Communication      Cognition Arousal/Alertness: Awake/alert Behavior During Therapy: WFL for tasks assessed/performed Overall Cognitive Status: Within Functional Limits for tasks  assessed                      General Comments      Exercises     Assessment/Plan    PT Assessment    PT Problem List            PT Treatment Interventions      PT Goals (Current goals can be found in the Care Plan section)  Acute Rehab PT Goals Patient Stated Goal: to go home PT Goal Formulation: All assessment and education complete, DC therapy    Frequency     Barriers to discharge        Co-evaluation               End of Session   Activity Tolerance: Patient tolerated treatment well Patient left: in chair;with call bell/phone within reach;with family/visitor present Nurse Communication: Mobility status    Functional Assessment Tool Used: clinical  judgement Functional Limitation: Mobility: Walking and moving around Mobility: Walking and Moving Around Current Status [id number]): At least 40 percent but less than 60 percent impaired, limited or restricted Mobility: Walking and Moving Around Goal Status [phone]): At least 40 percent but less than 60 percent impaired, limited or restricted Mobility: Walking and Moving Around Discharge Status [phone]): At least 40 percent but less than 60 percent impaired, limited or restricted    Time: 1530-1556 PT Time Calculation (min) (ACUTE ONLY): 26 min   Charges:   PT Evaluation $PT Eval Low Complexity: 1 Procedure PT Treatments $Therapeutic Activity: 8-22 mins   PT G Codes:   PT G-Codes **NOT FOR INPATIENT CLASS** Functional Assessment Tool Used: clinical judgement Functional Limitation: Mobility: Walking and moving around Mobility: Walking and Moving Around Current Status [id number]): At least 40 percent but less than 60 percent impaired, limited or restricted Mobility: Walking and Moving Around Goal Status [phone]): At least 40 percent but less than 60 percent impaired, limited or restricted Mobility: Walking and Moving Around Discharge Status [phone]): At least 40 percent but less than 60 percent impaired, limited or restricted    Claretha Cooper 02/10/2016, 5:11 PM Tresa Endo PT [phone]

## 2016-02-10 NOTE — Progress Notes (Signed)
Spoke with Linward Natal about pt [facility] orders and DME referral to start process CM spoke with pt and sons about DME needed to be pick up at [facility] on [address] [employer] information placed in d/c instructions Pt has finished working with PT in [facility] ED

## 2016-02-10 NOTE — ED Notes (Signed)
PT at bedside.

## 2016-02-10 NOTE — ED Notes (Signed)
ED Provider at bedside. 

## 2016-02-10 NOTE — ED Provider Notes (Signed)
[facility] DEPT Provider Note   CSN: [id number] Arrival date & time: 02/10/16  1004     History   Chief Complaint No chief complaint on file.   HPI Natasja HONORINE BRUCE is a 82 y.o. female.  Patient is 82 yo F with PMH of bilateral carpal tunnel syndrome, fibromyalgia, and spinal stenosis, presenting with sons who report worsening generalized joint pain over the past 24 hours. She's also been having difficulty walking due to pain and rising from a seated position. She was evaluated in ED on 11/24 for similar complaints, and MRI revealed evidence of lumbar spine stenosis but no acute osseous abnormalities. She was prescribed oxycodone at last visit, providing some relief, and had f/u appointment with PCP today, but was unable to make the appointment due to difficulty getting out of bed. Her sons report some confusion recently, but deny any headache, dizziness, vision changes, slurred speech, or change in mental status. She's had general weakness in her lower extremities, but no focal weakness or numbness. She has no other complaints and denies any recent fever, chills, chest pain, palpitations, shortness of breath, cough, abdominal pain, N/V/D, dysuria, or back pain. She lives at home with her son.      Past Medical History:  Diagnosis Date  . Carpal tunnel syndrome   . Depression   . Fibromyalgia   . Hypertension   . Hypothyroidism   . Incontinence   . Spinal stenosis     Patient Active Problem List   Diagnosis Date Noted  . Hypertension   . Incontinence   . Multinodular goiter   . Depression   . CLAUDICATION 12/02/2009  . CHEST PAIN 12/02/2009    Past Surgical History:  Procedure Laterality Date  . BREAST LUMPECTOMY  1965  . SPINE SURGERY      OB History    Gravida Para Term Preterm AB Living   6 5     1 4    SAB TAB Ectopic Multiple Live Births                   Home Medications    Prior to Admission medications   Medication Sig Start Date End Date  Taking? Authorizing Provider  Acetaminophen (TYLENOL ARTHRITIS PAIN PO) Take 1 tablet by mouth every 6 (six) hours.    Historical Provider, MD  Besifloxacin HCl (BESIVANCE) 0.6 % SUSP INSTILL ONE DROP INTO RIGHT EYE 4 TIMES A DAY FOR 2 DAYS AFTER EACH MONTHLY EYE INJECTION 12/18/15   Historical Provider, MD  Bevacizumab (AVASTIN IV) Injection of the eye    Historical Provider, MD  celecoxib (CELEBREX) 200 MG capsule Take 200 mg by mouth daily. with food 12/04/15   Historical Provider, MD  cephALEXin (KEFLEX) 500 MG capsule Take 1 capsule (500 mg total) by mouth 4 (four) times daily. Patient not taking: Reported on 02/06/2016 01/18/16   Courteney Lyn Mackuen, MD  ciprofloxacin (CIPRO) 250 MG tablet Take 250 mg by mouth every 12 (twelve) hours. X 7 days, started 11/20 02/02/16   Historical Provider, MD  COMBIGAN 0.2-0.5 % ophthalmic solution INSTILL 1 DROP INTO RIGHT EYE TWICE A DAY 05/13/15   Historical Provider, MD  docusate sodium (COLACE) 100 MG capsule Take 1 capsule (100 mg total) by mouth every 12 (twelve) hours. 02/06/16   [name], MD  DULoxetine (CYMBALTA) 30 MG capsule Take 30 mg by mouth daily.      Historical Provider, MD  Multiple Vitamin (MULTIVITAMIN WITH MINERALS) TABS tablet Take 1 tablet by  mouth daily.    Historical Provider, MD  oxyCODONE-acetaminophen (PERCOCET) 5-325 MG tablet Take 1-2 tablets by mouth every 4 (four) hours as needed. 02/06/16   [name], MD  polyethylene glycol (MIRALAX / GLYCOLAX) packet Take 17 g by mouth daily. Take every third day if no bowel movement while taking narcotics. 02/06/16   [name], MD  triamterene-hydrochlorothiazide ([id number]) 37.5-25 MG tablet TAKE 1 TABLET BY MOUTH IN THE MORNING 12/25/15   Historical Provider, MD    Family History Family History  Problem Relation Age of Onset  . Heart failure Father   . Prostate cancer Son     Social History Social History  Substance Use Topics  . Smoking status: Never Smoker  .  Smokeless tobacco: Never Used  . Alcohol use No     Allergies   Ibuprofen and Fish allergy   Review of Systems Review of Systems  All other systems reviewed and are negative.    Physical Exam Updated Vital Signs BP 126/65   Pulse 76   Temp 98.5 F (36.9 C) (Oral)   Resp 16   LMP 03/16/1963   SpO2 94%   Physical Exam  Constitutional: She is oriented to person, place, and time.  Elderly female, lying comfortably in bed, no acute distress.  HENT:  Head: Normocephalic and atraumatic.  Mouth/Throat: Oropharynx is clear and moist.  Eyes: Conjunctivae and EOM are normal. Pupils are equal, round, and reactive to light.  Neck: Normal range of motion.  Cardiovascular: Normal rate, regular rhythm, normal heart sounds and intact distal pulses.   Pulmonary/Chest: Effort normal and breath sounds normal. No respiratory distress.  Abdominal: Soft. Bowel sounds are normal. She exhibits no distension. There is no tenderness. There is no guarding.  Musculoskeletal: Normal range of motion. She exhibits no edema or tenderness.  FROM BLE and BUE. Moderate arthritic changes noted to bilateral wrists and metacarpals, as well as general arthritic enlargement of both knees. No erythema or warmth noted. No effusions of the knees. Calves are soft and nontender.  Neurological: She is alert and oriented to person, place, and time.  Speech is clear and goal oriented, follows commands. Cranial nerves III - XII without deficit, no facial droop. Normal strength in upper and lower extremities bilaterally. Grip strength 4\5 bilaterally due to general hand weakness and carpal tunnel per the patient.  Sensation normal to light and sharp touch. Moves extremities without ataxia, coordination intact. Normal finger to nose and rapid alternating movements. Negative Romberg, no pronator drift.  Skin: Skin is warm and dry.  Psychiatric: She has a normal mood and affect.  Nursing note and vitals  reviewed.    ED Treatments / Results  Labs (all labs ordered are listed, but only abnormal results are displayed) Labs Reviewed  URINALYSIS, ROUTINE W REFLEX MICROSCOPIC (NOT AT [facility]) - Abnormal; Notable for the following:       Result Value   APPearance CLOUDY (*)    Leukocytes, UA LARGE (*)    All other components within normal limits  URINE MICROSCOPIC-ADD ON - Abnormal; Notable for the following:    Squamous Epithelial / LPF 6-30 (*)    Bacteria, UA MANY (*)    All other components within normal limits  URINE CULTURE    EKG  EKG Interpretation None       Radiology No results found.  Procedures Procedures (including critical care time)  Medications Ordered in ED Medications - No data to display   Initial Impression / Assessment and  Plan / ED Course  I have reviewed the triage vital signs and the nursing notes.  Pertinent labs & imaging results that were available during my care of the patient were reviewed by me and considered in my medical decision making (see chart for details).  Clinical Course    Patient is 82 yo F presenting with sons who report worsening generalized joint pain and progressive mobility issues. She was evaluated in ED for similar complaint on 11/24, and MRI showed lumbar spinal stenosis but no other acute findings. On exam, arthritic changes noted to BUE and BLE, but no evidence of septic joint or focal neurologic deficits concerning for ICH or stroke. Further imaging deferred given MRI on 11/24. Urinalysis obtained and shows evidence of UTI. Empiric Keflex started and urine sent for culture. Case Management and PT consulted, who assisted patient and her sons with getting home health services and DME including wheelchair and commode. Short course of prescription Percocet provided, and patient and sons agreeable to f/u with PCP for management of chronic arthritis and general BLE weakness. Return precautions discussed for new or worsening  symptoms.  Final Clinical Impressions(s) / ED Diagnoses   Final diagnoses:  Weakness of both lower extremities  Arthritis    New Prescriptions New Prescriptions   No medications on file     Rosilyn Mings II, PA 02/11/16 1301    Malvin Johns, MD 02/21/16 0010

## 2016-02-10 NOTE — ED Notes (Signed)
Bed: WA08 Expected date:  Expected time:  Means of arrival:  Comments: EMS-forgot 

## 2016-02-11 LAB — URINE CULTURE

## 2016-02-12 DIAGNOSIS — M4807 Spinal stenosis, lumbosacral region: Secondary | ICD-10-CM | POA: Diagnosis not present

## 2016-02-12 DIAGNOSIS — K8020 Calculus of gallbladder without cholecystitis without obstruction: Secondary | ICD-10-CM | POA: Diagnosis not present

## 2016-02-13 DIAGNOSIS — F329 Major depressive disorder, single episode, unspecified: Secondary | ICD-10-CM | POA: Diagnosis not present

## 2016-02-13 DIAGNOSIS — E039 Hypothyroidism, unspecified: Secondary | ICD-10-CM | POA: Diagnosis not present

## 2016-02-13 DIAGNOSIS — I10 Essential (primary) hypertension: Secondary | ICD-10-CM | POA: Diagnosis not present

## 2016-02-13 DIAGNOSIS — M180 Bilateral primary osteoarthritis of first carpometacarpal joints: Secondary | ICD-10-CM | POA: Diagnosis not present

## 2016-02-13 DIAGNOSIS — N390 Urinary tract infection, site not specified: Secondary | ICD-10-CM | POA: Diagnosis not present

## 2016-02-13 DIAGNOSIS — M4800 Spinal stenosis, site unspecified: Secondary | ICD-10-CM | POA: Diagnosis not present

## 2016-02-13 DIAGNOSIS — R32 Unspecified urinary incontinence: Secondary | ICD-10-CM | POA: Diagnosis not present

## 2016-02-13 DIAGNOSIS — G5602 Carpal tunnel syndrome, left upper limb: Secondary | ICD-10-CM | POA: Diagnosis not present

## 2016-02-13 DIAGNOSIS — M797 Fibromyalgia: Secondary | ICD-10-CM | POA: Diagnosis not present

## 2016-02-16 DIAGNOSIS — N390 Urinary tract infection, site not specified: Secondary | ICD-10-CM | POA: Diagnosis not present

## 2016-02-16 DIAGNOSIS — M797 Fibromyalgia: Secondary | ICD-10-CM | POA: Diagnosis not present

## 2016-02-16 DIAGNOSIS — I10 Essential (primary) hypertension: Secondary | ICD-10-CM | POA: Diagnosis not present

## 2016-02-16 DIAGNOSIS — M4800 Spinal stenosis, site unspecified: Secondary | ICD-10-CM | POA: Diagnosis not present

## 2016-02-16 DIAGNOSIS — E039 Hypothyroidism, unspecified: Secondary | ICD-10-CM | POA: Diagnosis not present

## 2016-02-16 DIAGNOSIS — F329 Major depressive disorder, single episode, unspecified: Secondary | ICD-10-CM | POA: Diagnosis not present

## 2016-02-17 DIAGNOSIS — M5137 Other intervertebral disc degeneration, lumbosacral region: Secondary | ICD-10-CM | POA: Diagnosis not present

## 2016-02-17 DIAGNOSIS — M4807 Spinal stenosis, lumbosacral region: Secondary | ICD-10-CM | POA: Diagnosis not present

## 2016-02-18 DIAGNOSIS — N390 Urinary tract infection, site not specified: Secondary | ICD-10-CM | POA: Diagnosis not present

## 2016-02-18 DIAGNOSIS — M4800 Spinal stenosis, site unspecified: Secondary | ICD-10-CM | POA: Diagnosis not present

## 2016-02-18 DIAGNOSIS — E039 Hypothyroidism, unspecified: Secondary | ICD-10-CM | POA: Diagnosis not present

## 2016-02-18 DIAGNOSIS — I10 Essential (primary) hypertension: Secondary | ICD-10-CM | POA: Diagnosis not present

## 2016-02-18 DIAGNOSIS — M797 Fibromyalgia: Secondary | ICD-10-CM | POA: Diagnosis not present

## 2016-02-18 DIAGNOSIS — F329 Major depressive disorder, single episode, unspecified: Secondary | ICD-10-CM | POA: Diagnosis not present

## 2016-02-20 DIAGNOSIS — E039 Hypothyroidism, unspecified: Secondary | ICD-10-CM | POA: Diagnosis not present

## 2016-02-20 DIAGNOSIS — M797 Fibromyalgia: Secondary | ICD-10-CM | POA: Diagnosis not present

## 2016-02-20 DIAGNOSIS — F329 Major depressive disorder, single episode, unspecified: Secondary | ICD-10-CM | POA: Diagnosis not present

## 2016-02-20 DIAGNOSIS — I10 Essential (primary) hypertension: Secondary | ICD-10-CM | POA: Diagnosis not present

## 2016-02-20 DIAGNOSIS — N390 Urinary tract infection, site not specified: Secondary | ICD-10-CM | POA: Diagnosis not present

## 2016-02-20 DIAGNOSIS — M4800 Spinal stenosis, site unspecified: Secondary | ICD-10-CM | POA: Diagnosis not present

## 2016-02-23 DIAGNOSIS — I10 Essential (primary) hypertension: Secondary | ICD-10-CM | POA: Diagnosis not present

## 2016-02-23 DIAGNOSIS — M797 Fibromyalgia: Secondary | ICD-10-CM | POA: Diagnosis not present

## 2016-02-23 DIAGNOSIS — E039 Hypothyroidism, unspecified: Secondary | ICD-10-CM | POA: Diagnosis not present

## 2016-02-23 DIAGNOSIS — M4800 Spinal stenosis, site unspecified: Secondary | ICD-10-CM | POA: Diagnosis not present

## 2016-02-23 DIAGNOSIS — F329 Major depressive disorder, single episode, unspecified: Secondary | ICD-10-CM | POA: Diagnosis not present

## 2016-02-23 DIAGNOSIS — N390 Urinary tract infection, site not specified: Secondary | ICD-10-CM | POA: Diagnosis not present

## 2016-02-24 DIAGNOSIS — E039 Hypothyroidism, unspecified: Secondary | ICD-10-CM | POA: Diagnosis not present

## 2016-02-24 DIAGNOSIS — N390 Urinary tract infection, site not specified: Secondary | ICD-10-CM | POA: Diagnosis not present

## 2016-02-24 DIAGNOSIS — M797 Fibromyalgia: Secondary | ICD-10-CM | POA: Diagnosis not present

## 2016-02-24 DIAGNOSIS — I10 Essential (primary) hypertension: Secondary | ICD-10-CM | POA: Diagnosis not present

## 2016-02-24 DIAGNOSIS — M4800 Spinal stenosis, site unspecified: Secondary | ICD-10-CM | POA: Diagnosis not present

## 2016-02-24 DIAGNOSIS — F329 Major depressive disorder, single episode, unspecified: Secondary | ICD-10-CM | POA: Diagnosis not present

## 2016-02-25 DIAGNOSIS — M797 Fibromyalgia: Secondary | ICD-10-CM | POA: Diagnosis not present

## 2016-02-25 DIAGNOSIS — E039 Hypothyroidism, unspecified: Secondary | ICD-10-CM | POA: Diagnosis not present

## 2016-02-25 DIAGNOSIS — M4800 Spinal stenosis, site unspecified: Secondary | ICD-10-CM | POA: Diagnosis not present

## 2016-02-25 DIAGNOSIS — H401133 Primary open-angle glaucoma, bilateral, severe stage: Secondary | ICD-10-CM | POA: Diagnosis not present

## 2016-02-25 DIAGNOSIS — I10 Essential (primary) hypertension: Secondary | ICD-10-CM | POA: Diagnosis not present

## 2016-02-25 DIAGNOSIS — F329 Major depressive disorder, single episode, unspecified: Secondary | ICD-10-CM | POA: Diagnosis not present

## 2016-02-25 DIAGNOSIS — N390 Urinary tract infection, site not specified: Secondary | ICD-10-CM | POA: Diagnosis not present

## 2016-02-26 ENCOUNTER — Encounter (INDEPENDENT_AMBULATORY_CARE_PROVIDER_SITE_OTHER): Payer: Medicare Other | Admitting: Ophthalmology

## 2016-02-26 DIAGNOSIS — I10 Essential (primary) hypertension: Secondary | ICD-10-CM | POA: Diagnosis not present

## 2016-02-26 DIAGNOSIS — H35033 Hypertensive retinopathy, bilateral: Secondary | ICD-10-CM | POA: Diagnosis not present

## 2016-02-26 DIAGNOSIS — H34831 Tributary (branch) retinal vein occlusion, right eye, with macular edema: Secondary | ICD-10-CM

## 2016-02-26 DIAGNOSIS — H43813 Vitreous degeneration, bilateral: Secondary | ICD-10-CM

## 2016-02-27 DIAGNOSIS — M797 Fibromyalgia: Secondary | ICD-10-CM | POA: Diagnosis not present

## 2016-02-27 DIAGNOSIS — M4800 Spinal stenosis, site unspecified: Secondary | ICD-10-CM | POA: Diagnosis not present

## 2016-02-27 DIAGNOSIS — E039 Hypothyroidism, unspecified: Secondary | ICD-10-CM | POA: Diagnosis not present

## 2016-02-27 DIAGNOSIS — I10 Essential (primary) hypertension: Secondary | ICD-10-CM | POA: Diagnosis not present

## 2016-02-27 DIAGNOSIS — N390 Urinary tract infection, site not specified: Secondary | ICD-10-CM | POA: Diagnosis not present

## 2016-02-27 DIAGNOSIS — F329 Major depressive disorder, single episode, unspecified: Secondary | ICD-10-CM | POA: Diagnosis not present

## 2016-03-02 DIAGNOSIS — M4800 Spinal stenosis, site unspecified: Secondary | ICD-10-CM | POA: Diagnosis not present

## 2016-03-02 DIAGNOSIS — E039 Hypothyroidism, unspecified: Secondary | ICD-10-CM | POA: Diagnosis not present

## 2016-03-02 DIAGNOSIS — N390 Urinary tract infection, site not specified: Secondary | ICD-10-CM | POA: Diagnosis not present

## 2016-03-02 DIAGNOSIS — I10 Essential (primary) hypertension: Secondary | ICD-10-CM | POA: Diagnosis not present

## 2016-03-02 DIAGNOSIS — F329 Major depressive disorder, single episode, unspecified: Secondary | ICD-10-CM | POA: Diagnosis not present

## 2016-03-02 DIAGNOSIS — M797 Fibromyalgia: Secondary | ICD-10-CM | POA: Diagnosis not present

## 2016-03-03 DIAGNOSIS — M797 Fibromyalgia: Secondary | ICD-10-CM | POA: Diagnosis not present

## 2016-03-03 DIAGNOSIS — M4800 Spinal stenosis, site unspecified: Secondary | ICD-10-CM | POA: Diagnosis not present

## 2016-03-03 DIAGNOSIS — F329 Major depressive disorder, single episode, unspecified: Secondary | ICD-10-CM | POA: Diagnosis not present

## 2016-03-03 DIAGNOSIS — I10 Essential (primary) hypertension: Secondary | ICD-10-CM | POA: Diagnosis not present

## 2016-03-03 DIAGNOSIS — E039 Hypothyroidism, unspecified: Secondary | ICD-10-CM | POA: Diagnosis not present

## 2016-03-03 DIAGNOSIS — N390 Urinary tract infection, site not specified: Secondary | ICD-10-CM | POA: Diagnosis not present

## 2016-03-04 DIAGNOSIS — N390 Urinary tract infection, site not specified: Secondary | ICD-10-CM | POA: Diagnosis not present

## 2016-03-04 DIAGNOSIS — I10 Essential (primary) hypertension: Secondary | ICD-10-CM | POA: Diagnosis not present

## 2016-03-04 DIAGNOSIS — M4800 Spinal stenosis, site unspecified: Secondary | ICD-10-CM | POA: Diagnosis not present

## 2016-03-04 DIAGNOSIS — M797 Fibromyalgia: Secondary | ICD-10-CM | POA: Diagnosis not present

## 2016-03-04 DIAGNOSIS — F329 Major depressive disorder, single episode, unspecified: Secondary | ICD-10-CM | POA: Diagnosis not present

## 2016-03-04 DIAGNOSIS — E039 Hypothyroidism, unspecified: Secondary | ICD-10-CM | POA: Diagnosis not present

## 2016-03-05 ENCOUNTER — Emergency Department (HOSPITAL_BASED_OUTPATIENT_CLINIC_OR_DEPARTMENT_OTHER)
Admission: EM | Admit: 2016-03-05 | Discharge: 2016-03-05 | Disposition: A | Discharge status: Home / Self Care | Payer: Medicare Other | Attending: Emergency Medicine | Admitting: Emergency Medicine

## 2016-03-05 ENCOUNTER — Encounter (HOSPITAL_BASED_OUTPATIENT_CLINIC_OR_DEPARTMENT_OTHER): Payer: Self-pay

## 2016-03-05 DIAGNOSIS — Z79899 Other long term (current) drug therapy: Secondary | ICD-10-CM | POA: Diagnosis not present

## 2016-03-05 DIAGNOSIS — Z760 Encounter for issue of repeat prescription: Secondary | ICD-10-CM | POA: Diagnosis not present

## 2016-03-05 DIAGNOSIS — E039 Hypothyroidism, unspecified: Secondary | ICD-10-CM | POA: Diagnosis not present

## 2016-03-05 DIAGNOSIS — I10 Essential (primary) hypertension: Secondary | ICD-10-CM | POA: Insufficient documentation

## 2016-03-05 MED ORDER — OXYCODONE-ACETAMINOPHEN 5-325 MG PO TABS: 1.0000 | ORAL_TABLET | ORAL | 0 refills | Status: DC | PRN

## 2016-03-05 NOTE — ED Provider Notes (Signed)
[facility] DEPT [facility] Provider Note   CSN: [id number] Arrival date & time: 03/05/16  1913 By signing my name below, I, Kerry Garcia, attest that this documentation has been prepared under the direction and in the presence of Kerry Garcia, [place]. Electronically Signed: Georgette Garcia, ED Scribe. 03/05/16. 9:46 PM.  History   Chief Complaint Chief Complaint  Patient presents with  . Medication Refill   The history is provided by the patient and the spouse. No language interpreter was used.   HPI Comments: Kerry Garcia is a 82 y.o. female with h/o HTN who presents to the Emergency Department for a medication refill. Pt states she ran out of Oxycodone 5/325 this morning. She is currently on it for her chronic pain. Pt states she called her PCP who's office was closed for today. Per pt's husband, he is slowly trying to "wean her off of it". Pt appears to be in no acute distress. VSS. No other complaints at this time.   Past Medical History:  Diagnosis Date  . Carpal tunnel syndrome   . Depression   . Fibromyalgia   . Hypertension   . Hypothyroidism   . Incontinence   . Spinal stenosis     Patient Active Problem List   Diagnosis Date Noted  . Hypertension   . Incontinence   . Multinodular goiter   . Depression   . CLAUDICATION 12/02/2009  . CHEST PAIN 12/02/2009    Past Surgical History:  Procedure Laterality Date  . BREAST LUMPECTOMY  1965  . SPINE SURGERY      OB History    Gravida Para Term Preterm AB Living   6 5     1 4    SAB TAB Ectopic Multiple Live Births                   Home Medications    Prior to Admission medications   Medication Sig Start Date End Date Taking? Authorizing Provider  Acetaminophen (TYLENOL ARTHRITIS PAIN PO) Take 1 tablet by mouth every 6 (six) hours as needed (Pain).     Historical Provider, MD  Besifloxacin HCl (BESIVANCE) 0.6 % SUSP INSTILL ONE DROP INTO RIGHT EYE 4 TIMES A DAY FOR 2 DAYS AFTER EACH MONTHLY EYE INJECTION 12/18/15    Historical Provider, MD  Bevacizumab (AVASTIN IV) Injection of the eye    Historical Provider, MD  celecoxib (CELEBREX) 200 MG capsule Take 200 mg by mouth daily. with food 12/04/15   Historical Provider, MD  ciprofloxacin (CIPRO) 250 MG tablet Take 250 mg by mouth every 12 (twelve) hours. X 7 days, started 11/20 02/02/16   Historical Provider, MD  COMBIGAN 0.2-0.5 % ophthalmic solution INSTILL 1 DROP INTO RIGHT EYE TWICE A DAY 05/13/15   Historical Provider, MD  docusate sodium (COLACE) 100 MG capsule Take 1 capsule (100 mg total) by mouth every 12 (twelve) hours. Patient taking differently: Take 100 mg by mouth daily as needed for mild constipation.  02/06/16   [name], MD  DULoxetine (CYMBALTA) 30 MG capsule Take 30 mg by mouth daily.      Historical Provider, MD  Multiple Vitamin (MULTIVITAMIN WITH MINERALS) TABS tablet Take 1 tablet by mouth daily.    Historical Provider, MD  oxyCODONE-acetaminophen (PERCOCET) 5-325 MG tablet Take 1-2 tablets by mouth every 4 (four) hours as needed. 02/10/16   Daryl F de Villier II, PA  polyethylene glycol (MIRALAX / GLYCOLAX) packet Take 17 g by mouth daily. Take every third day if no  bowel movement while taking narcotics. Patient taking differently: Take 17 g by mouth daily as needed for mild constipation. Take every third day if no bowel movement while taking narcotics. 02/06/16   [name], MD  triamterene-hydrochlorothiazide ([id number]) 37.5-25 MG tablet TAKE 1 TABLET BY MOUTH IN THE MORNING 12/25/15   Historical Provider, MD    Family History Family History  Problem Relation Age of Onset  . Heart failure Father   . Prostate cancer Son     Social History Social History  Substance Use Topics  . Smoking status: Never Smoker  . Smokeless tobacco: Never Used  . Alcohol use No     Allergies   Ibuprofen and Fish allergy   Review of Systems Review of Systems  Constitutional: Negative for chills and fever.  Gastrointestinal:  Negative for nausea and vomiting.  Neurological: Negative for headaches.  All other systems reviewed and are negative.  Physical Exam Updated Vital Signs BP (!) 122/51 (BP Location: Right Arm)   Pulse 81   Temp 98.2 F (36.8 C) (Oral)   Resp 18   Ht 5\' 3"  (1.6 m)   Wt 199 lb (90.3 kg)   LMP 03/16/1963   SpO2 98%   BMI 35.25 kg/m   Physical Exam  Constitutional: She appears well-developed and well-nourished.  HENT:  Head: Normocephalic.  Eyes: Conjunctivae are normal.  Cardiovascular: Normal rate.   Pulmonary/Chest: Effort normal. No respiratory distress.  Abdominal: She exhibits no distension.  Musculoskeletal: Normal range of motion.  Neurological: She is alert.  Skin: Skin is warm and dry.  Psychiatric: She has a normal mood and affect. Her behavior is normal.  Nursing note and vitals reviewed.    ED Treatments / Results  DIAGNOSTIC STUDIES: Oxygen Saturation is 98% on RA, normal by my interpretation.    COORDINATION OF CARE: 9:44 PM Discussed treatment plan with pt at bedside which includes hand pt agreed to plan.  Labs (all labs ordered are listed, but only abnormal results are displayed) Labs Reviewed - No data to display  EKG  EKG Interpretation None       Radiology No results found.  Procedures Procedures (including critical care time)  Medications Ordered in ED Medications - No data to display   Initial Impression / Assessment and Plan / ED Course  I have reviewed the triage vital signs and the nursing notes.  Pertinent labs & imaging results that were available during my care of the patient were reviewed by me and considered in my medical decision making (see chart for details).  Clinical Course     Meds ordered this encounter  Medications  . oxyCODONE-acetaminophen (PERCOCET) 5-325 MG tablet    Sig: Take 1-2 tablets by mouth every 4 (four) hours as needed.    Dispense:  16 tablet    Refill:  0    Order Specific Question:    Supervising Provider    Answer:   Lajean Saver [id number]    Final Clinical Impressions(s) / ED Diagnoses   Final diagnoses:  Medication refill  Pt advised must see primary to manage her pain medications.  I gave pt Rx as this is a holiday weekend and pt needs.   New Prescriptions New Prescriptions   No medications on file   An After Visit Summary was printed and given to the patient.  I personally performed the services in this documentation, which was scribed in my presence.  The recorded information has been reviewed and considered.   Ronnald Collum.  Hollace Kinnier [place], PA-C 03/06/16 [place], MD 03/08/16 [phone]

## 2016-03-05 NOTE — ED Triage Notes (Signed)
Pt states she ran out of oxycodone 5/325 this am-for chronic pain-presents to triage in own w/c-NAD

## 2016-03-05 NOTE — Discharge Instructions (Signed)
You need to talk to Dr. Delfina Redwood about pain medication.   The emergency department can not manage your pain medications.   We will not be able to prescribe narcotics to you for chronic pain.

## 2016-03-09 DIAGNOSIS — I10 Essential (primary) hypertension: Secondary | ICD-10-CM | POA: Diagnosis not present

## 2016-03-09 DIAGNOSIS — M797 Fibromyalgia: Secondary | ICD-10-CM | POA: Diagnosis not present

## 2016-03-09 DIAGNOSIS — N390 Urinary tract infection, site not specified: Secondary | ICD-10-CM | POA: Diagnosis not present

## 2016-03-09 DIAGNOSIS — F329 Major depressive disorder, single episode, unspecified: Secondary | ICD-10-CM | POA: Diagnosis not present

## 2016-03-09 DIAGNOSIS — M4800 Spinal stenosis, site unspecified: Secondary | ICD-10-CM | POA: Diagnosis not present

## 2016-03-09 DIAGNOSIS — E039 Hypothyroidism, unspecified: Secondary | ICD-10-CM | POA: Diagnosis not present

## 2016-03-11 DIAGNOSIS — E039 Hypothyroidism, unspecified: Secondary | ICD-10-CM | POA: Diagnosis not present

## 2016-03-11 DIAGNOSIS — M797 Fibromyalgia: Secondary | ICD-10-CM | POA: Diagnosis not present

## 2016-03-11 DIAGNOSIS — I10 Essential (primary) hypertension: Secondary | ICD-10-CM | POA: Diagnosis not present

## 2016-03-11 DIAGNOSIS — N390 Urinary tract infection, site not specified: Secondary | ICD-10-CM | POA: Diagnosis not present

## 2016-03-11 DIAGNOSIS — M4800 Spinal stenosis, site unspecified: Secondary | ICD-10-CM | POA: Diagnosis not present

## 2016-03-11 DIAGNOSIS — F329 Major depressive disorder, single episode, unspecified: Secondary | ICD-10-CM | POA: Diagnosis not present

## 2016-03-18 DIAGNOSIS — E039 Hypothyroidism, unspecified: Secondary | ICD-10-CM | POA: Diagnosis not present

## 2016-03-18 DIAGNOSIS — F329 Major depressive disorder, single episode, unspecified: Secondary | ICD-10-CM | POA: Diagnosis not present

## 2016-03-18 DIAGNOSIS — I10 Essential (primary) hypertension: Secondary | ICD-10-CM | POA: Diagnosis not present

## 2016-03-18 DIAGNOSIS — M797 Fibromyalgia: Secondary | ICD-10-CM | POA: Diagnosis not present

## 2016-03-18 DIAGNOSIS — M4800 Spinal stenosis, site unspecified: Secondary | ICD-10-CM | POA: Diagnosis not present

## 2016-03-18 DIAGNOSIS — N390 Urinary tract infection, site not specified: Secondary | ICD-10-CM | POA: Diagnosis not present

## 2016-03-22 DIAGNOSIS — M797 Fibromyalgia: Secondary | ICD-10-CM | POA: Diagnosis not present

## 2016-03-22 DIAGNOSIS — I10 Essential (primary) hypertension: Secondary | ICD-10-CM | POA: Diagnosis not present

## 2016-03-22 DIAGNOSIS — E039 Hypothyroidism, unspecified: Secondary | ICD-10-CM | POA: Diagnosis not present

## 2016-03-22 DIAGNOSIS — M4800 Spinal stenosis, site unspecified: Secondary | ICD-10-CM | POA: Diagnosis not present

## 2016-03-22 DIAGNOSIS — N390 Urinary tract infection, site not specified: Secondary | ICD-10-CM | POA: Diagnosis not present

## 2016-03-22 DIAGNOSIS — F329 Major depressive disorder, single episode, unspecified: Secondary | ICD-10-CM | POA: Diagnosis not present

## 2016-03-23 DIAGNOSIS — E7800 Pure hypercholesterolemia, unspecified: Secondary | ICD-10-CM | POA: Diagnosis not present

## 2016-03-23 DIAGNOSIS — F324 Major depressive disorder, single episode, in partial remission: Secondary | ICD-10-CM | POA: Diagnosis not present

## 2016-03-23 DIAGNOSIS — Z1389 Encounter for screening for other disorder: Secondary | ICD-10-CM | POA: Diagnosis not present

## 2016-03-23 DIAGNOSIS — I10 Essential (primary) hypertension: Secondary | ICD-10-CM | POA: Diagnosis not present

## 2016-03-23 DIAGNOSIS — E042 Nontoxic multinodular goiter: Secondary | ICD-10-CM | POA: Diagnosis not present

## 2016-03-23 DIAGNOSIS — K8020 Calculus of gallbladder without cholecystitis without obstruction: Secondary | ICD-10-CM | POA: Diagnosis not present

## 2016-03-23 DIAGNOSIS — Z6834 Body mass index (BMI) 34.0-34.9, adult: Secondary | ICD-10-CM | POA: Diagnosis not present

## 2016-03-23 DIAGNOSIS — R269 Unspecified abnormalities of gait and mobility: Secondary | ICD-10-CM | POA: Diagnosis not present

## 2016-03-23 DIAGNOSIS — E663 Overweight: Secondary | ICD-10-CM | POA: Diagnosis not present

## 2016-03-23 DIAGNOSIS — Z23 Encounter for immunization: Secondary | ICD-10-CM | POA: Diagnosis not present

## 2016-03-23 DIAGNOSIS — Z0000 Encounter for general adult medical examination without abnormal findings: Secondary | ICD-10-CM | POA: Diagnosis not present

## 2016-03-24 DIAGNOSIS — I10 Essential (primary) hypertension: Secondary | ICD-10-CM | POA: Diagnosis not present

## 2016-03-24 DIAGNOSIS — N390 Urinary tract infection, site not specified: Secondary | ICD-10-CM | POA: Diagnosis not present

## 2016-03-24 DIAGNOSIS — F329 Major depressive disorder, single episode, unspecified: Secondary | ICD-10-CM | POA: Diagnosis not present

## 2016-03-24 DIAGNOSIS — M797 Fibromyalgia: Secondary | ICD-10-CM | POA: Diagnosis not present

## 2016-03-24 DIAGNOSIS — M4800 Spinal stenosis, site unspecified: Secondary | ICD-10-CM | POA: Diagnosis not present

## 2016-03-24 DIAGNOSIS — E039 Hypothyroidism, unspecified: Secondary | ICD-10-CM | POA: Diagnosis not present

## 2016-03-26 DIAGNOSIS — M180 Bilateral primary osteoarthritis of first carpometacarpal joints: Secondary | ICD-10-CM | POA: Insufficient documentation

## 2016-03-26 DIAGNOSIS — G5602 Carpal tunnel syndrome, left upper limb: Secondary | ICD-10-CM | POA: Diagnosis not present

## 2016-03-29 DIAGNOSIS — M797 Fibromyalgia: Secondary | ICD-10-CM | POA: Diagnosis not present

## 2016-03-29 DIAGNOSIS — I10 Essential (primary) hypertension: Secondary | ICD-10-CM | POA: Diagnosis not present

## 2016-03-29 DIAGNOSIS — N390 Urinary tract infection, site not specified: Secondary | ICD-10-CM | POA: Diagnosis not present

## 2016-03-29 DIAGNOSIS — E039 Hypothyroidism, unspecified: Secondary | ICD-10-CM | POA: Diagnosis not present

## 2016-03-29 DIAGNOSIS — F329 Major depressive disorder, single episode, unspecified: Secondary | ICD-10-CM | POA: Diagnosis not present

## 2016-03-29 DIAGNOSIS — M4800 Spinal stenosis, site unspecified: Secondary | ICD-10-CM | POA: Diagnosis not present

## 2016-03-30 DIAGNOSIS — M5137 Other intervertebral disc degeneration, lumbosacral region: Secondary | ICD-10-CM | POA: Diagnosis not present

## 2016-04-01 ENCOUNTER — Ambulatory Visit: Payer: Medicare Other | Admitting: Podiatry

## 2016-04-02 DIAGNOSIS — E039 Hypothyroidism, unspecified: Secondary | ICD-10-CM | POA: Diagnosis not present

## 2016-04-02 DIAGNOSIS — F329 Major depressive disorder, single episode, unspecified: Secondary | ICD-10-CM | POA: Diagnosis not present

## 2016-04-02 DIAGNOSIS — N390 Urinary tract infection, site not specified: Secondary | ICD-10-CM | POA: Diagnosis not present

## 2016-04-02 DIAGNOSIS — M797 Fibromyalgia: Secondary | ICD-10-CM | POA: Diagnosis not present

## 2016-04-02 DIAGNOSIS — M4800 Spinal stenosis, site unspecified: Secondary | ICD-10-CM | POA: Diagnosis not present

## 2016-04-02 DIAGNOSIS — I10 Essential (primary) hypertension: Secondary | ICD-10-CM | POA: Diagnosis not present

## 2016-05-04 DIAGNOSIS — F324 Major depressive disorder, single episode, in partial remission: Secondary | ICD-10-CM | POA: Diagnosis not present

## 2016-05-06 ENCOUNTER — Encounter (INDEPENDENT_AMBULATORY_CARE_PROVIDER_SITE_OTHER): Payer: Medicare Other | Admitting: Ophthalmology

## 2016-05-06 DIAGNOSIS — H35033 Hypertensive retinopathy, bilateral: Secondary | ICD-10-CM | POA: Diagnosis not present

## 2016-05-06 DIAGNOSIS — H43813 Vitreous degeneration, bilateral: Secondary | ICD-10-CM | POA: Diagnosis not present

## 2016-05-06 DIAGNOSIS — I10 Essential (primary) hypertension: Secondary | ICD-10-CM

## 2016-05-06 DIAGNOSIS — H34831 Tributary (branch) retinal vein occlusion, right eye, with macular edema: Secondary | ICD-10-CM

## 2016-05-31 ENCOUNTER — Telehealth: Payer: Self-pay | Admitting: *Deleted

## 2016-05-31 DIAGNOSIS — N390 Urinary tract infection, site not specified: Secondary | ICD-10-CM | POA: Diagnosis not present

## 2016-05-31 NOTE — Telephone Encounter (Signed)
Pt called for appt time. I informed pt the appt was 06/03/2016 at 3:45pm.

## 2016-06-03 ENCOUNTER — Ambulatory Visit (INDEPENDENT_AMBULATORY_CARE_PROVIDER_SITE_OTHER): Payer: Medicare Other | Admitting: Podiatry

## 2016-06-03 ENCOUNTER — Encounter: Payer: Self-pay | Admitting: Podiatry

## 2016-06-03 DIAGNOSIS — M79609 Pain in unspecified limb: Secondary | ICD-10-CM

## 2016-06-03 DIAGNOSIS — B351 Tinea unguium: Secondary | ICD-10-CM | POA: Diagnosis not present

## 2016-06-03 DIAGNOSIS — I739 Peripheral vascular disease, unspecified: Secondary | ICD-10-CM

## 2016-06-03 NOTE — Progress Notes (Signed)
Patient ID: Kerry Garcia, female   DOB: 12/08/1925, 83 y.o.   MRN: [id number] Complaint:  Visit Type: Patient returns to my office for continued preventative foot care services. Complaint: Patient states" my nails have grown long and thick and become painful to walk and wear shoes" . The patient presents for preventative foot care services. No changes to ROS  Podiatric Exam: Vascular: dorsalis pedis pulses are palpable bilateral. Her posterior tibial pulses are non palpable. . Capillary return is immediate. Temperature gradient is WNL. Skin turgor WNL  Sensorium: Normal Semmes Weinstein monofilament test. Normal tactile sensation bilaterally. Nail Exam: Pt has thick disfigured discolored nails with subungual debris noted bilateral entire nail hallux through fifth toenails Ulcer Exam: There is no evidence of ulcer or pre-ulcerative changes or infection. Orthopedic Exam: Muscle tone and strength are WNL. No limitations in general ROM. No crepitus or effusions noted. Foot type and digits show no abnormalities. Bony prominences are unremarkable. Skin: No Porokeratosis. No infection or ulcers  Diagnosis:  Onychomycosis, , Pain in right toes, pain in left toes  Treatment & Plan Procedures and Treatment: Consent by patient was obtained for treatment procedures. The patient understood the discussion of treatment and procedures well. All questions were answered thoroughly reviewed. Debridement of mycotic and hypertrophic toenails, 1 through 5 bilateral and clearing of subungual debris. No ulceration, no infection noted.  Return Visit-Office Procedure: Patient instructed to return to the office for a follow up visit 3 months for continued evaluation and treatment.    Gardiner Barefoot DPM

## 2016-06-25 DIAGNOSIS — M180 Bilateral primary osteoarthritis of first carpometacarpal joints: Secondary | ICD-10-CM | POA: Diagnosis not present

## 2016-06-25 DIAGNOSIS — G5602 Carpal tunnel syndrome, left upper limb: Secondary | ICD-10-CM | POA: Diagnosis not present

## 2016-07-28 ENCOUNTER — Encounter (INDEPENDENT_AMBULATORY_CARE_PROVIDER_SITE_OTHER): Payer: Medicare Other | Admitting: Ophthalmology

## 2016-07-28 DIAGNOSIS — H35033 Hypertensive retinopathy, bilateral: Secondary | ICD-10-CM | POA: Diagnosis not present

## 2016-07-28 DIAGNOSIS — H34831 Tributary (branch) retinal vein occlusion, right eye, with macular edema: Secondary | ICD-10-CM | POA: Diagnosis not present

## 2016-07-28 DIAGNOSIS — I10 Essential (primary) hypertension: Secondary | ICD-10-CM | POA: Diagnosis not present

## 2016-07-28 DIAGNOSIS — H43813 Vitreous degeneration, bilateral: Secondary | ICD-10-CM | POA: Diagnosis not present

## 2016-08-19 DIAGNOSIS — H01003 Unspecified blepharitis right eye, unspecified eyelid: Secondary | ICD-10-CM | POA: Diagnosis not present

## 2016-08-19 DIAGNOSIS — H401133 Primary open-angle glaucoma, bilateral, severe stage: Secondary | ICD-10-CM | POA: Diagnosis not present

## 2016-08-19 DIAGNOSIS — Z961 Presence of intraocular lens: Secondary | ICD-10-CM | POA: Diagnosis not present

## 2016-08-19 DIAGNOSIS — H04123 Dry eye syndrome of bilateral lacrimal glands: Secondary | ICD-10-CM | POA: Diagnosis not present

## 2016-08-26 ENCOUNTER — Encounter: Payer: Self-pay | Admitting: Podiatry

## 2016-08-26 ENCOUNTER — Ambulatory Visit (INDEPENDENT_AMBULATORY_CARE_PROVIDER_SITE_OTHER): Payer: Medicare Other | Admitting: Podiatry

## 2016-08-26 DIAGNOSIS — B351 Tinea unguium: Secondary | ICD-10-CM

## 2016-08-26 DIAGNOSIS — M79676 Pain in unspecified toe(s): Secondary | ICD-10-CM | POA: Diagnosis not present

## 2016-08-26 DIAGNOSIS — M79609 Pain in unspecified limb: Principal | ICD-10-CM

## 2016-08-26 DIAGNOSIS — I739 Peripheral vascular disease, unspecified: Secondary | ICD-10-CM

## 2016-08-26 NOTE — Progress Notes (Signed)
Patient ID: Kerry Garcia, female   DOB: 09/02/1925, 83 y.o.   MRN: [id number] Complaint:  Visit Type: Patient returns to my office for continued preventative foot care services. Complaint: Patient states" my nails have grown long and thick and become painful to walk and wear shoes" . The patient presents for preventative foot care services. No changes to ROS  Podiatric Exam: Vascular: dorsalis pedis pulses are palpable bilateral. Her posterior tibial pulses are non palpable. . Capillary return is immediate. Temperature gradient is WNL. Skin turgor WNL  Sensorium: Normal Semmes Weinstein monofilament test. Normal tactile sensation bilaterally. Nail Exam: Pt has thick disfigured discolored nails with subungual debris noted bilateral entire nail hallux through fifth toenails Ulcer Exam: There is no evidence of ulcer or pre-ulcerative changes or infection. Orthopedic Exam: Muscle tone and strength are WNL. No limitations in general ROM. No crepitus or effusions noted. Foot type and digits show no abnormalities. Bony prominences are unremarkable. Skin: No Porokeratosis. No infection or ulcers  Diagnosis:  Onychomycosis, , Pain in right toes, pain in left toes  Treatment & Plan Procedures and Treatment: Consent by patient was obtained for treatment procedures. The patient understood the discussion of treatment and procedures well. All questions were answered thoroughly reviewed. Debridement of mycotic and hypertrophic toenails, 1 through 5 bilateral and clearing of subungual debris. No ulceration, no infection noted.  Return Visit-Office Procedure: Patient instructed to return to the office for a follow up visit 3 months for continued evaluation and treatment.    Caydin Yeatts DPM 

## 2016-08-27 DIAGNOSIS — G5602 Carpal tunnel syndrome, left upper limb: Secondary | ICD-10-CM | POA: Diagnosis not present

## 2016-08-27 DIAGNOSIS — M19042 Primary osteoarthritis, left hand: Secondary | ICD-10-CM | POA: Insufficient documentation

## 2016-08-27 DIAGNOSIS — M180 Bilateral primary osteoarthritis of first carpometacarpal joints: Secondary | ICD-10-CM | POA: Diagnosis not present

## 2016-09-01 DIAGNOSIS — R350 Frequency of micturition: Secondary | ICD-10-CM | POA: Diagnosis not present

## 2016-10-06 DIAGNOSIS — D2230 Melanocytic nevi of unspecified part of face: Secondary | ICD-10-CM | POA: Diagnosis not present

## 2016-10-06 DIAGNOSIS — D224 Melanocytic nevi of scalp and neck: Secondary | ICD-10-CM | POA: Diagnosis not present

## 2016-10-06 DIAGNOSIS — D2271 Melanocytic nevi of right lower limb, including hip: Secondary | ICD-10-CM | POA: Diagnosis not present

## 2016-10-06 DIAGNOSIS — D225 Melanocytic nevi of trunk: Secondary | ICD-10-CM | POA: Diagnosis not present

## 2016-10-06 DIAGNOSIS — D2371 Other benign neoplasm of skin of right lower limb, including hip: Secondary | ICD-10-CM | POA: Diagnosis not present

## 2016-10-06 DIAGNOSIS — D2272 Melanocytic nevi of left lower limb, including hip: Secondary | ICD-10-CM | POA: Diagnosis not present

## 2016-10-06 DIAGNOSIS — L700 Acne vulgaris: Secondary | ICD-10-CM | POA: Diagnosis not present

## 2016-10-06 DIAGNOSIS — R234 Changes in skin texture: Secondary | ICD-10-CM | POA: Diagnosis not present

## 2016-11-02 DIAGNOSIS — E785 Hyperlipidemia, unspecified: Secondary | ICD-10-CM | POA: Diagnosis not present

## 2016-11-02 DIAGNOSIS — M19042 Primary osteoarthritis, left hand: Secondary | ICD-10-CM | POA: Diagnosis not present

## 2016-11-02 DIAGNOSIS — E7800 Pure hypercholesterolemia, unspecified: Secondary | ICD-10-CM | POA: Diagnosis not present

## 2016-11-02 DIAGNOSIS — F324 Major depressive disorder, single episode, in partial remission: Secondary | ICD-10-CM | POA: Diagnosis not present

## 2016-11-02 DIAGNOSIS — E039 Hypothyroidism, unspecified: Secondary | ICD-10-CM | POA: Diagnosis not present

## 2016-11-02 DIAGNOSIS — E042 Nontoxic multinodular goiter: Secondary | ICD-10-CM | POA: Diagnosis not present

## 2016-11-02 DIAGNOSIS — I10 Essential (primary) hypertension: Secondary | ICD-10-CM | POA: Diagnosis not present

## 2016-11-02 DIAGNOSIS — R350 Frequency of micturition: Secondary | ICD-10-CM | POA: Diagnosis not present

## 2016-11-04 ENCOUNTER — Encounter (INDEPENDENT_AMBULATORY_CARE_PROVIDER_SITE_OTHER): Payer: Medicare Other | Admitting: Ophthalmology

## 2016-11-04 DIAGNOSIS — H43813 Vitreous degeneration, bilateral: Secondary | ICD-10-CM | POA: Diagnosis not present

## 2016-11-04 DIAGNOSIS — I10 Essential (primary) hypertension: Secondary | ICD-10-CM | POA: Diagnosis not present

## 2016-11-04 DIAGNOSIS — H34831 Tributary (branch) retinal vein occlusion, right eye, with macular edema: Secondary | ICD-10-CM

## 2016-11-04 DIAGNOSIS — H35033 Hypertensive retinopathy, bilateral: Secondary | ICD-10-CM | POA: Diagnosis not present

## 2016-11-30 ENCOUNTER — Ambulatory Visit (INDEPENDENT_AMBULATORY_CARE_PROVIDER_SITE_OTHER): Payer: Medicare Other | Admitting: Podiatry

## 2016-11-30 ENCOUNTER — Encounter: Payer: Self-pay | Admitting: Podiatry

## 2016-11-30 DIAGNOSIS — B351 Tinea unguium: Secondary | ICD-10-CM

## 2016-11-30 DIAGNOSIS — M79609 Pain in unspecified limb: Secondary | ICD-10-CM

## 2016-11-30 DIAGNOSIS — I739 Peripheral vascular disease, unspecified: Secondary | ICD-10-CM

## 2016-11-30 NOTE — Progress Notes (Signed)
Patient ID: Kerry Garcia, female   DOB: [birth year], 83 y.o.   MRN: [id number] Complaint:  Visit Type: Patient returns to my office for continued preventative foot care services. Complaint: Patient states" my nails have grown long and thick and become painful to walk and wear shoes" . The patient presents for preventative foot care services. No changes to ROS  Podiatric Exam: Vascular: dorsalis pedis pulses are palpable bilateral. Her posterior tibial pulses are non palpable. . Capillary return is immediate. Temperature gradient is WNL. Skin turgor WNL  Sensorium: Normal Semmes Weinstein monofilament test. Normal tactile sensation bilaterally. Nail Exam: Pt has thick disfigured discolored nails with subungual debris noted bilateral entire nail hallux through fifth toenails Ulcer Exam: There is no evidence of ulcer or pre-ulcerative changes or infection. Orthopedic Exam: Muscle tone and strength are WNL. No limitations in general ROM. No crepitus or effusions noted. Foot type and digits show no abnormalities. Bony prominences are unremarkable. Skin: No Porokeratosis. No infection or ulcers  Diagnosis:  Onychomycosis, , Pain in right toes, pain in left toes  Treatment & Plan Procedures and Treatment: Consent by patient was obtained for treatment procedures. The patient understood the discussion of treatment and procedures well. All questions were answered thoroughly reviewed. Debridement of mycotic and hypertrophic toenails, 1 through 5 bilateral and clearing of subungual debris. No ulceration, no infection noted.  Return Visit-Office Procedure: Patient instructed to return to the office for a follow up visit 3 months for continued evaluation and treatment.    Gardiner Barefoot DPM

## 2016-12-30 DIAGNOSIS — I10 Essential (primary) hypertension: Secondary | ICD-10-CM | POA: Diagnosis not present

## 2016-12-30 DIAGNOSIS — M19042 Primary osteoarthritis, left hand: Secondary | ICD-10-CM | POA: Diagnosis not present

## 2016-12-30 DIAGNOSIS — E039 Hypothyroidism, unspecified: Secondary | ICD-10-CM | POA: Diagnosis not present

## 2016-12-30 DIAGNOSIS — F324 Major depressive disorder, single episode, in partial remission: Secondary | ICD-10-CM | POA: Diagnosis not present

## 2016-12-30 DIAGNOSIS — E785 Hyperlipidemia, unspecified: Secondary | ICD-10-CM | POA: Diagnosis not present

## 2017-01-03 DIAGNOSIS — F324 Major depressive disorder, single episode, in partial remission: Secondary | ICD-10-CM | POA: Diagnosis not present

## 2017-02-24 ENCOUNTER — Encounter (INDEPENDENT_AMBULATORY_CARE_PROVIDER_SITE_OTHER): Payer: Medicare Other | Admitting: Ophthalmology

## 2017-03-01 ENCOUNTER — Ambulatory Visit: Payer: Medicare Other | Admitting: Podiatry

## 2017-03-11 DIAGNOSIS — G5602 Carpal tunnel syndrome, left upper limb: Secondary | ICD-10-CM | POA: Diagnosis not present

## 2017-03-11 DIAGNOSIS — M180 Bilateral primary osteoarthritis of first carpometacarpal joints: Secondary | ICD-10-CM | POA: Diagnosis not present

## 2017-03-24 ENCOUNTER — Encounter (INDEPENDENT_AMBULATORY_CARE_PROVIDER_SITE_OTHER): Payer: Medicare Other | Admitting: Ophthalmology

## 2017-03-29 ENCOUNTER — Ambulatory Visit: Payer: Medicare Other | Admitting: Podiatry

## 2017-06-07 DIAGNOSIS — E039 Hypothyroidism, unspecified: Secondary | ICD-10-CM | POA: Diagnosis not present

## 2017-06-07 DIAGNOSIS — Z7189 Other specified counseling: Secondary | ICD-10-CM | POA: Diagnosis not present

## 2017-06-07 DIAGNOSIS — E7800 Pure hypercholesterolemia, unspecified: Secondary | ICD-10-CM | POA: Diagnosis not present

## 2017-06-07 DIAGNOSIS — F324 Major depressive disorder, single episode, in partial remission: Secondary | ICD-10-CM | POA: Diagnosis not present

## 2017-06-07 DIAGNOSIS — Z0000 Encounter for general adult medical examination without abnormal findings: Secondary | ICD-10-CM | POA: Diagnosis not present

## 2017-06-07 DIAGNOSIS — I10 Essential (primary) hypertension: Secondary | ICD-10-CM | POA: Diagnosis not present

## 2017-06-07 DIAGNOSIS — E042 Nontoxic multinodular goiter: Secondary | ICD-10-CM | POA: Diagnosis not present

## 2017-06-07 DIAGNOSIS — Z1389 Encounter for screening for other disorder: Secondary | ICD-10-CM | POA: Diagnosis not present

## 2017-07-27 ENCOUNTER — Ambulatory Visit (INDEPENDENT_AMBULATORY_CARE_PROVIDER_SITE_OTHER): Payer: Medicare Other | Admitting: Podiatry

## 2017-07-27 ENCOUNTER — Encounter: Payer: Self-pay | Admitting: Podiatry

## 2017-07-27 DIAGNOSIS — B351 Tinea unguium: Secondary | ICD-10-CM

## 2017-07-27 DIAGNOSIS — I739 Peripheral vascular disease, unspecified: Secondary | ICD-10-CM | POA: Diagnosis not present

## 2017-07-27 DIAGNOSIS — M79676 Pain in unspecified toe(s): Secondary | ICD-10-CM | POA: Diagnosis not present

## 2017-07-27 DIAGNOSIS — M79609 Pain in unspecified limb: Principal | ICD-10-CM

## 2017-07-27 NOTE — Progress Notes (Addendum)
Patient ID: Kerry Garcia, female   DOB: 08-14-1925, 84 y.o.   MRN: [id number] Complaint:  Visit Type: Patient returns to my office for continued preventative foot care services. Complaint: Patient states" my nails have grown long and thick and become painful to walk and wear shoes" . The patient presents for preventative foot care services. No changes to ROS.  Patient has not been seen for over 7 months.  Podiatric Exam: Vascular: dorsalis pedis pulses are palpable bilateral. Her posterior tibial pulses are non palpable. . Capillary return is immediate. Temperature gradient is WNL. Skin turgor WNL  Sensorium: Normal Semmes Weinstein monofilament test. Normal tactile sensation bilaterally. Nail Exam: Pt has thick disfigured discolored nails with subungual debris noted bilateral entire nail hallux through fifth toenails Ulcer Exam: There is no evidence of ulcer or pre-ulcerative changes or infection. Orthopedic Exam: Muscle tone and strength are WNL. No limitations in general ROM. No crepitus or effusions noted. Foot type and digits show no abnormalities. Bony prominences are unremarkable. Skin: No Porokeratosis. No infection or ulcers  Diagnosis:  Onychomycosis, , Pain in right toes, pain in left toes  Treatment & Plan Procedures and Treatment: Consent by patient was obtained for treatment procedures. The patient understood the discussion of treatment and procedures well. All questions were answered thoroughly reviewed. Debridement of mycotic and hypertrophic toenails, 1 through 5 bilateral and clearing of subungual debris. No ulceration, no infection noted. ABN signed for 2019. Return Visit-Office Procedure: Patient instructed to return to the office for a follow up visit 3 months for continued evaluation and treatment.    Gardiner Barefoot DPM

## 2017-07-28 ENCOUNTER — Telehealth: Payer: Self-pay | Admitting: Podiatry

## 2017-07-28 NOTE — Telephone Encounter (Signed)
I corrected all the medications in her chart, Ammie please call this patient to inform her of the correction

## 2017-07-28 NOTE — Telephone Encounter (Signed)
I was there in your office yesterday and the medications they have me on, I'm not on them. I am on the [place], the medicine for depression and Asprin. All this other medication I'm not on. I'm sorry we didn't get this straightened out while I was there but nobody asked me. I'm not fully aware of what I was supposed to do. I just picked up my papers and came home. The tylenol for the arthritis pain I take and the medication for depression I take. All this other medicine I'm not on at this time. This is going to be a confusion. Can someone sit down and talk with me about it. Thank you. [phone].

## 2017-07-28 NOTE — Addendum Note (Signed)
Addended by: Roney Jaffe on: 07/28/2017 04:32 PM   Modules accepted: Orders

## 2017-07-29 ENCOUNTER — Telehealth: Payer: Self-pay | Admitting: Podiatry

## 2017-07-29 ENCOUNTER — Telehealth: Payer: Self-pay | Admitting: *Deleted

## 2017-07-29 NOTE — Telephone Encounter (Signed)
Called patient,no answer, left message that medications have been updated in Chart.

## 2017-07-29 NOTE — Telephone Encounter (Signed)
I missed a call from someone. I had spoke with the machine about my medicine list being wrong. You can call me back if you want to at [phone].

## 2017-08-03 NOTE — Telephone Encounter (Signed)
Entered in error

## 2017-09-23 DIAGNOSIS — R600 Localized edema: Secondary | ICD-10-CM | POA: Diagnosis not present

## 2017-09-23 DIAGNOSIS — F324 Major depressive disorder, single episode, in partial remission: Secondary | ICD-10-CM | POA: Diagnosis not present

## 2017-10-28 ENCOUNTER — Ambulatory Visit: Payer: Medicare Other | Admitting: Podiatry

## 2017-11-26 DIAGNOSIS — N390 Urinary tract infection, site not specified: Secondary | ICD-10-CM | POA: Diagnosis not present

## 2017-11-26 DIAGNOSIS — J069 Acute upper respiratory infection, unspecified: Secondary | ICD-10-CM | POA: Diagnosis not present

## 2017-12-06 DIAGNOSIS — R600 Localized edema: Secondary | ICD-10-CM | POA: Diagnosis not present

## 2017-12-06 DIAGNOSIS — F324 Major depressive disorder, single episode, in partial remission: Secondary | ICD-10-CM | POA: Diagnosis not present

## 2017-12-06 DIAGNOSIS — Z23 Encounter for immunization: Secondary | ICD-10-CM | POA: Diagnosis not present

## 2017-12-06 DIAGNOSIS — E663 Overweight: Secondary | ICD-10-CM | POA: Diagnosis not present

## 2017-12-06 DIAGNOSIS — E7800 Pure hypercholesterolemia, unspecified: Secondary | ICD-10-CM | POA: Diagnosis not present

## 2017-12-06 DIAGNOSIS — Z6833 Body mass index (BMI) 33.0-33.9, adult: Secondary | ICD-10-CM | POA: Diagnosis not present

## 2017-12-06 DIAGNOSIS — I10 Essential (primary) hypertension: Secondary | ICD-10-CM | POA: Diagnosis not present

## 2017-12-20 ENCOUNTER — Encounter: Payer: Self-pay | Admitting: Podiatry

## 2017-12-20 ENCOUNTER — Ambulatory Visit (INDEPENDENT_AMBULATORY_CARE_PROVIDER_SITE_OTHER): Payer: Medicare Other | Admitting: Podiatry

## 2017-12-20 DIAGNOSIS — M79609 Pain in unspecified limb: Principal | ICD-10-CM

## 2017-12-20 DIAGNOSIS — M79676 Pain in unspecified toe(s): Secondary | ICD-10-CM | POA: Diagnosis not present

## 2017-12-20 DIAGNOSIS — B351 Tinea unguium: Secondary | ICD-10-CM | POA: Diagnosis not present

## 2017-12-25 NOTE — Progress Notes (Signed)
Subjective:   Patient ID: Kerry Garcia, female   DOB: 84 y.o.   MRN: [id number]   HPI Patient presents with elongated nailbeds 1-5 both feet that are increasingly painful and she cannot wear shoe gear comfortably due to the thickness and discomfort in the corners   ROS      Objective:  Physical Exam  Neurovascular status intact with thick yellow brittle nailbeds 1-5 both feet that are incurvated in the corners     Assessment:  Chronic mycotic nail infection with pain     Plan:  Debride painful nailbeds 1-5 both feet with no iatrogenic bleeding noted

## 2018-03-03 IMAGING — CR DG CHEST 2V
2 series · 2 of 2 positions shown · non-contrast
Comparison: 05/24/2010

CLINICAL DATA: Chest pain radiating to the right ear.

EXAM:
CHEST  2 VIEW

[w chest lat]
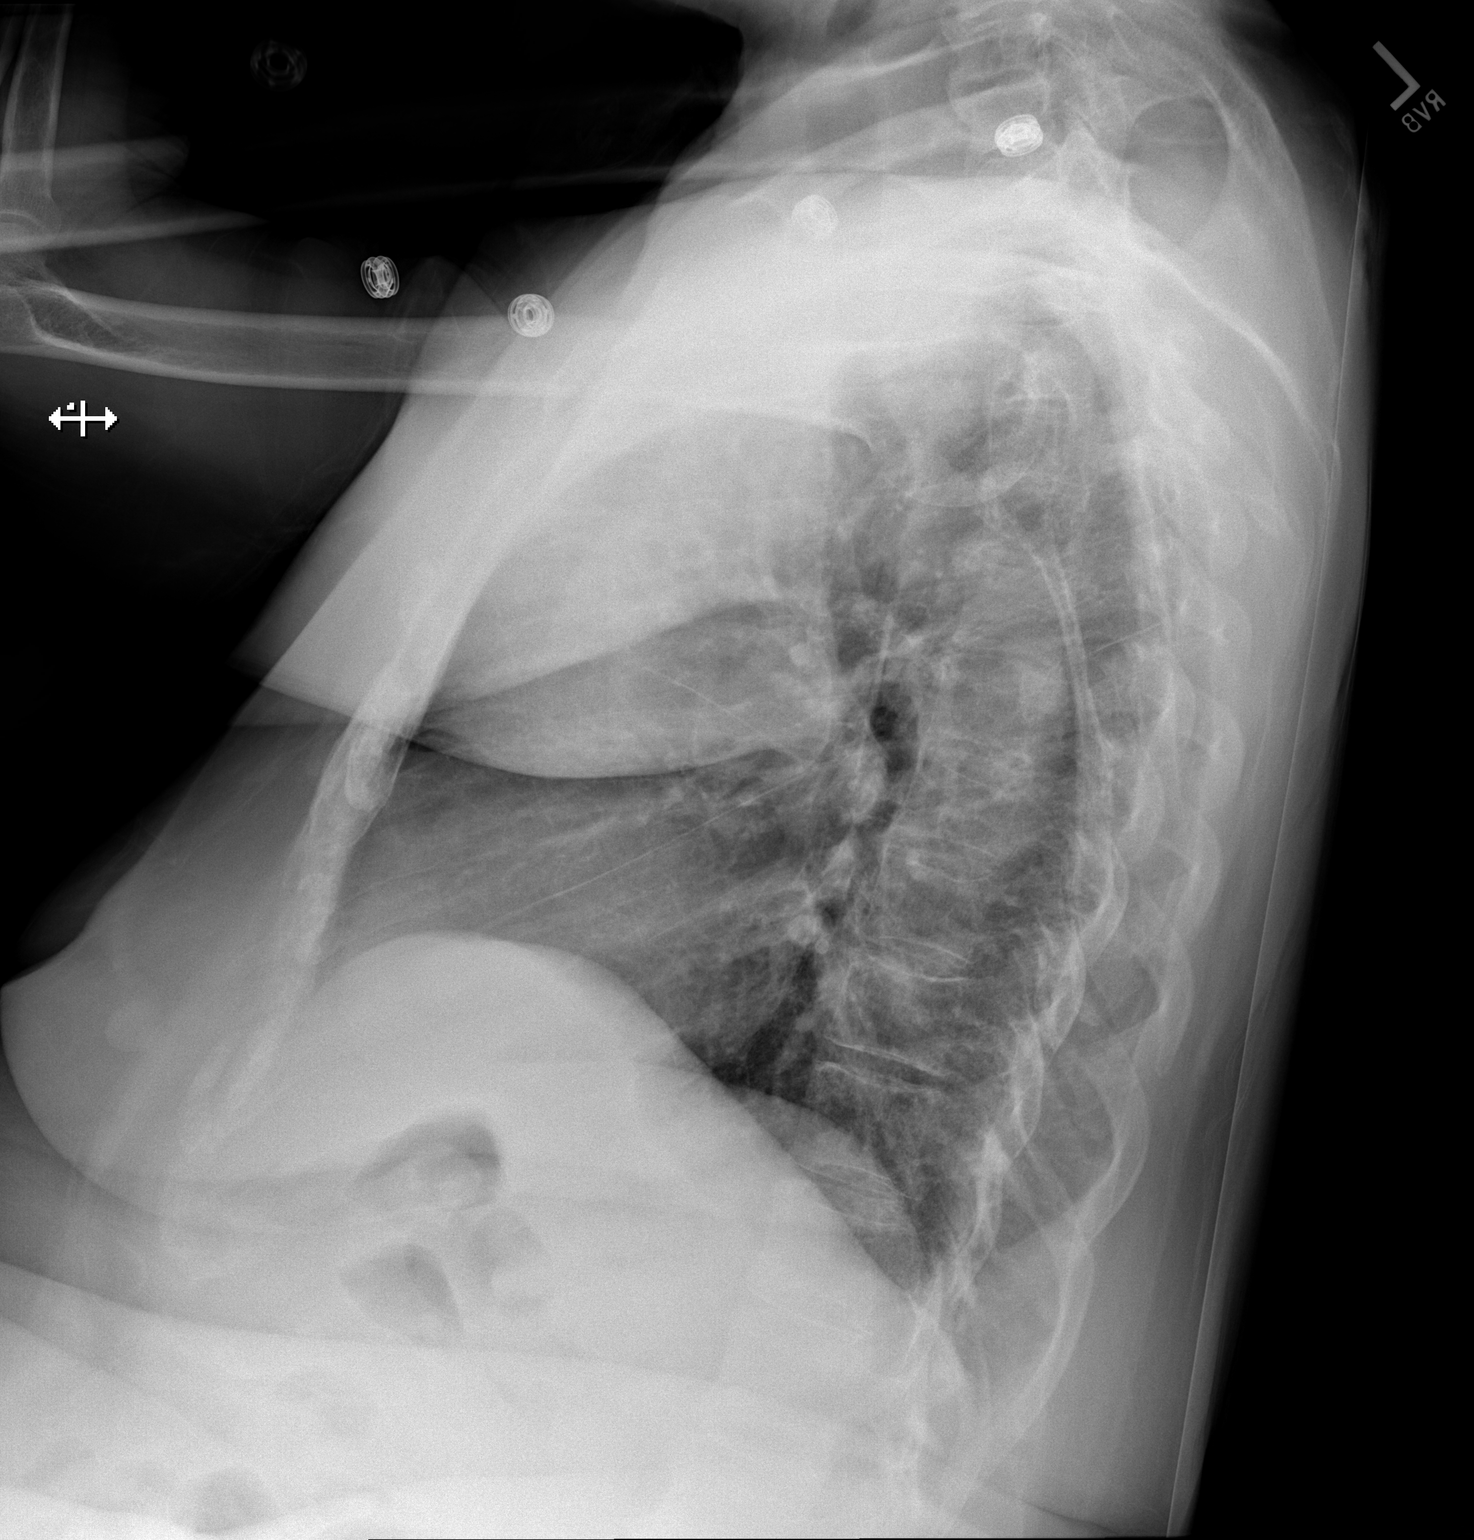

[x chest ap]
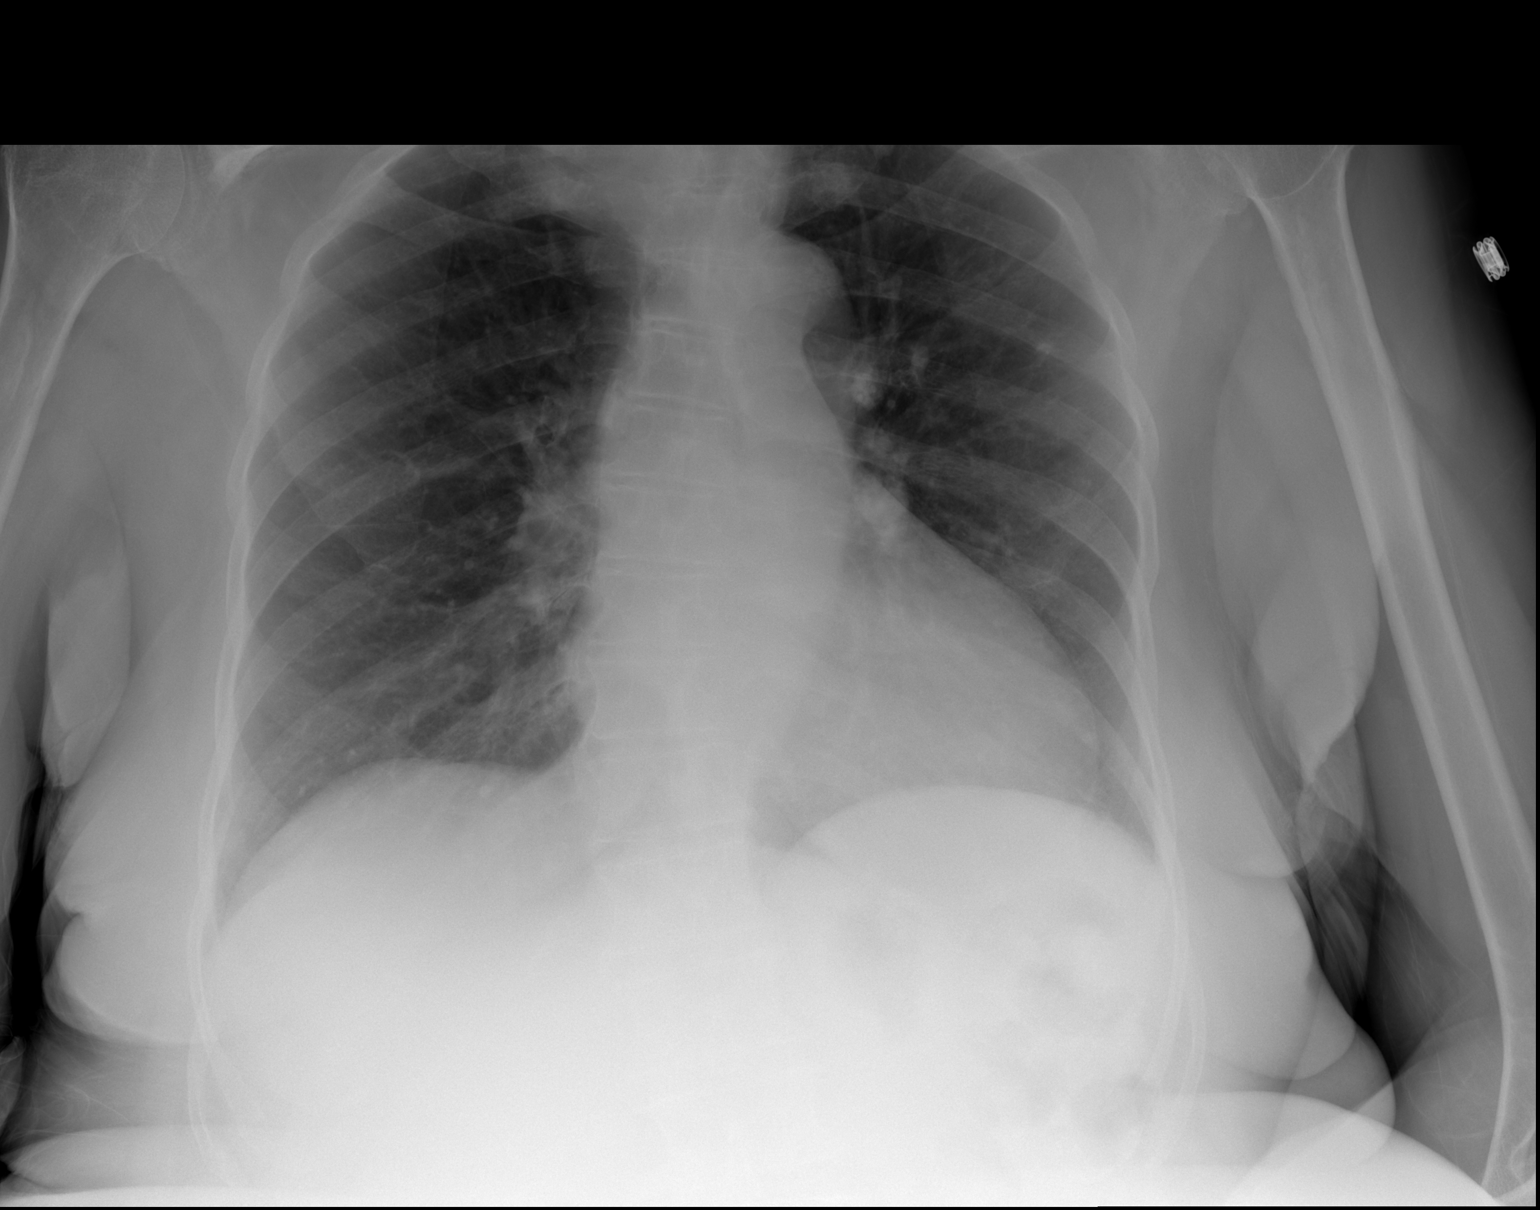

[2 of 2 positions shown; findings below may reference images not displayed]

FINDINGS: Right peritracheal mass correlates with the thyroid goiter by CT
resulting in leftward tracheal deviation. Calcified granulomas in
the left upper lobe and left hilum as before. Stable mild
cardiomegaly without CHF, focal pneumonia, collapse or
consolidation. No effusion or pneumothorax. S shaped scoliosis of
the spine. Degenerative changes of the spine and shoulders also
evident.
IMPRESSION: Stable chest exam without superimposed acute process.

Remote granulomatous disease

Substernal extension of the thyroid goiter resulting in the right
peritracheal mass.

## 2018-03-13 DIAGNOSIS — M180 Bilateral primary osteoarthritis of first carpometacarpal joints: Secondary | ICD-10-CM | POA: Diagnosis not present

## 2018-03-13 DIAGNOSIS — G5602 Carpal tunnel syndrome, left upper limb: Secondary | ICD-10-CM | POA: Diagnosis not present

## 2018-03-21 ENCOUNTER — Encounter: Payer: Self-pay | Admitting: Podiatry

## 2018-03-21 ENCOUNTER — Ambulatory Visit (INDEPENDENT_AMBULATORY_CARE_PROVIDER_SITE_OTHER): Payer: Medicare Other | Admitting: Podiatry

## 2018-03-21 DIAGNOSIS — I739 Peripheral vascular disease, unspecified: Secondary | ICD-10-CM

## 2018-03-21 DIAGNOSIS — M79609 Pain in unspecified limb: Secondary | ICD-10-CM

## 2018-03-21 DIAGNOSIS — B351 Tinea unguium: Secondary | ICD-10-CM | POA: Diagnosis not present

## 2018-03-21 NOTE — Progress Notes (Signed)
Patient ID: Kerry Garcia, female   DOB: 10-Sep-1925, 85 y.o.   MRN: [id number] Complaint:  Visit Type: Patient returns to my office for continued preventative foot care services. Complaint: Patient states" my nails have grown long and thick and become painful to walk and wear shoes" . The patient presents for preventative foot care services. No changes to ROS.  Patient has not been seen for over 7 months.  Podiatric Exam: Vascular: dorsalis pedis pulses are palpable bilateral. Her posterior tibial pulses are non palpable. . Capillary return is immediate. Temperature gradient is WNL. Skin turgor WNL  Sensorium: Normal Semmes Weinstein monofilament test. Normal tactile sensation bilaterally. Nail Exam: Pt has thick disfigured discolored nails with subungual debris noted bilateral entire nail hallux through fifth toenails Ulcer Exam: There is no evidence of ulcer or pre-ulcerative changes or infection. Orthopedic Exam: Muscle tone and strength are WNL. No limitations in general ROM. No crepitus or effusions noted. Foot type and digits show no abnormalities. Bony prominences are unremarkable. Skin: No Porokeratosis. No infection or ulcers  Diagnosis:  Onychomycosis, , Pain in right toes, pain in left toes  Treatment & Plan Procedures and Treatment: Consent by patient was obtained for treatment procedures. The patient understood the discussion of treatment and procedures well. All questions were answered thoroughly reviewed. Debridement of mycotic and hypertrophic toenails, 1 through 5 bilateral and clearing of subungual debris. No ulceration, no infection noted. ABN signed for 2020.Marland Kitchen Return Visit-Office Procedure: Patient instructed to return to the office for a follow up visit 3 months for continued evaluation and treatment.    Gardiner Barefoot DPM

## 2018-03-22 IMAGING — MR MR LUMBAR SPINE W/O CM
4 of 5 series · 17 of 48 positions shown · non-contrast
Comparison: Lumbar MRI 07/09/2004.

CLINICAL DATA: 89-year-old female with generalized weakness and
difficulty walking for several weeks, progressive for 7 days. Lower
extremity weakness. Initial encounter.

EXAM:
MRI LUMBAR SPINE WITHOUT CONTRAST
TECHNIQUE: Multiplanar, multisequence MR imaging of the lumbar spine was
performed. No intravenous contrast was administered.

[Series 4: T1 · sagittal · 4.0mm · 0.51mm/px · 3 of 15 slices shown (1 of 2)]
[im 3/15]
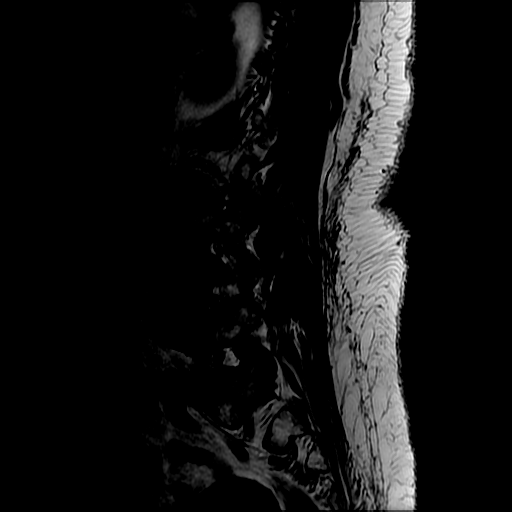
[im 9/15]
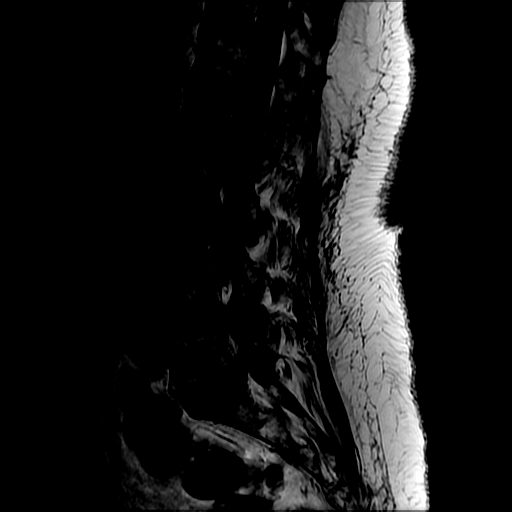
[im 15/15]
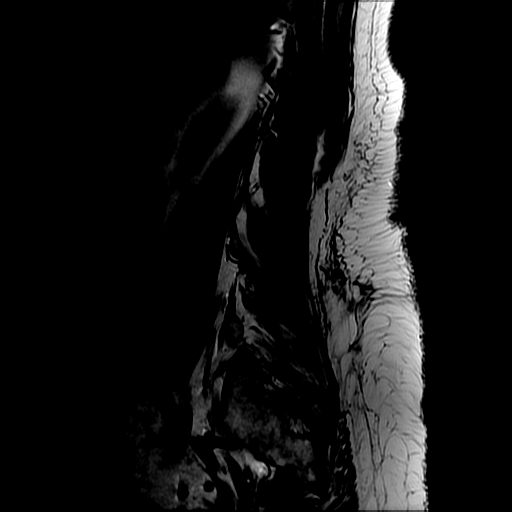

[Series 5: T2 post-contrast · sagittal · 4.0mm · 0.51mm/px · 6 of 15 slices shown]
[im 1/15]
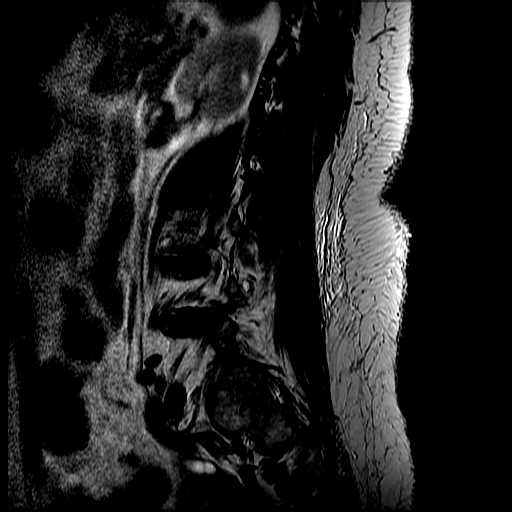
[im 3/15]
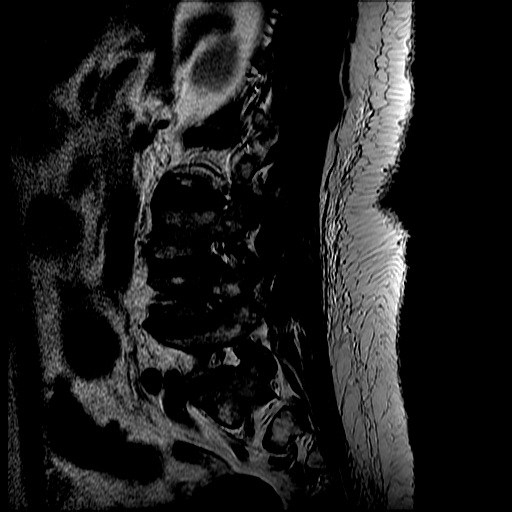
[im 6/15]
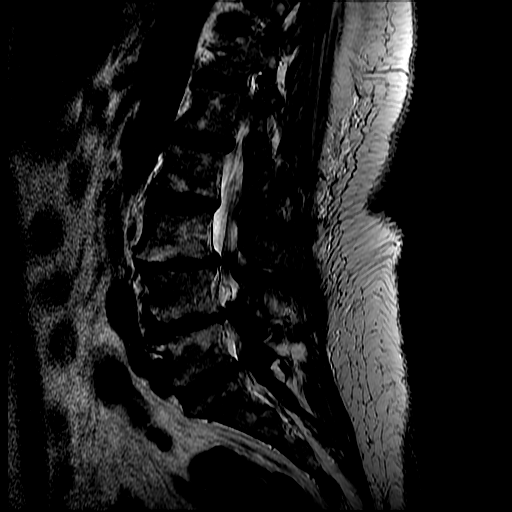
[im 9/15]
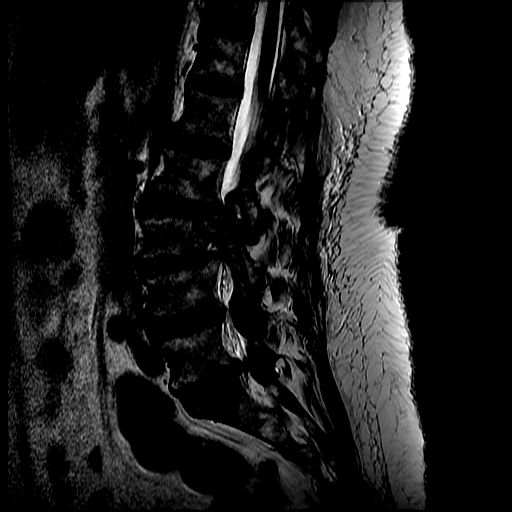
[im 12/15]
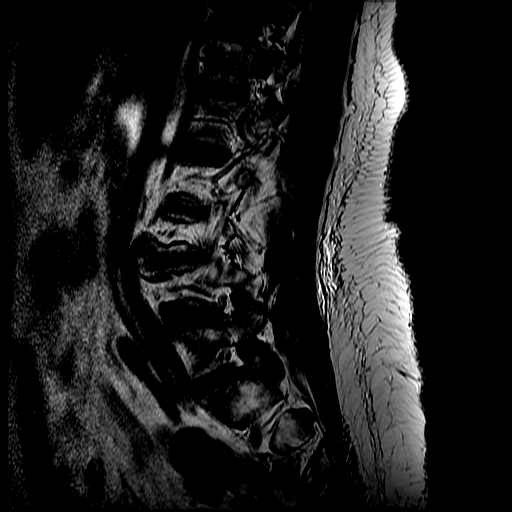
[im 15/15]
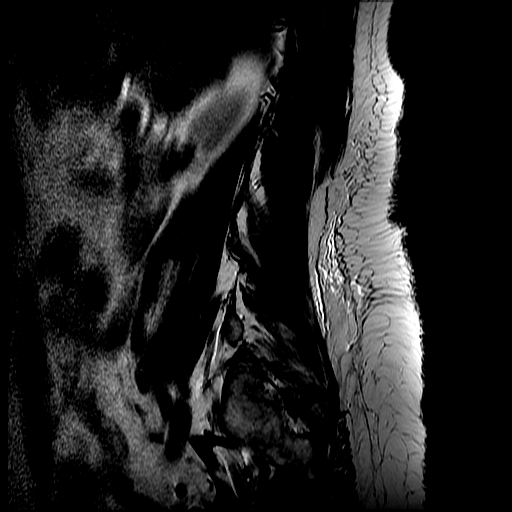

[Series 8: T1 · axial · 4.0mm · 0.39mm/px · z∈[+14,+158]mm · 3 of 36 slices shown (2 of 2)]
[im 6/36]
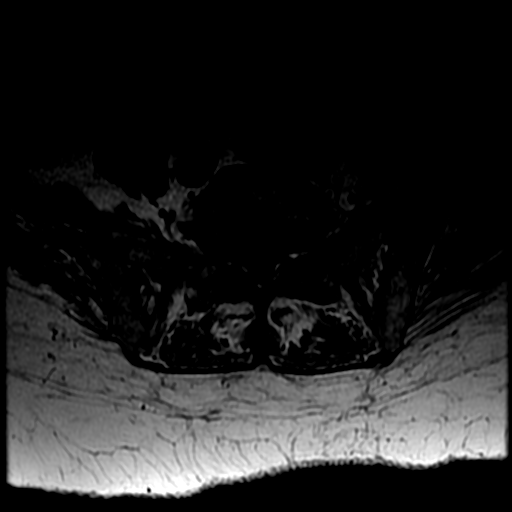
[im 18/36]
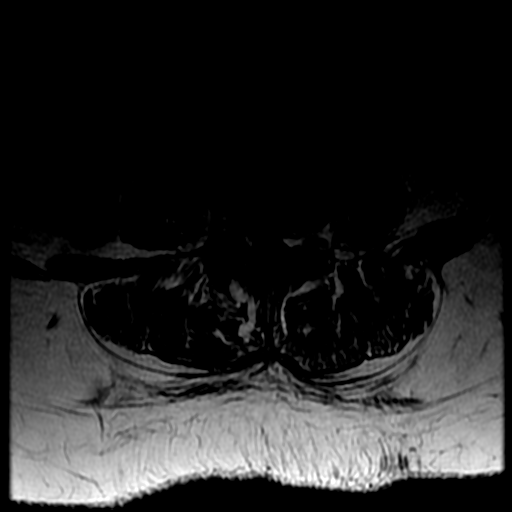
[im 31/36]
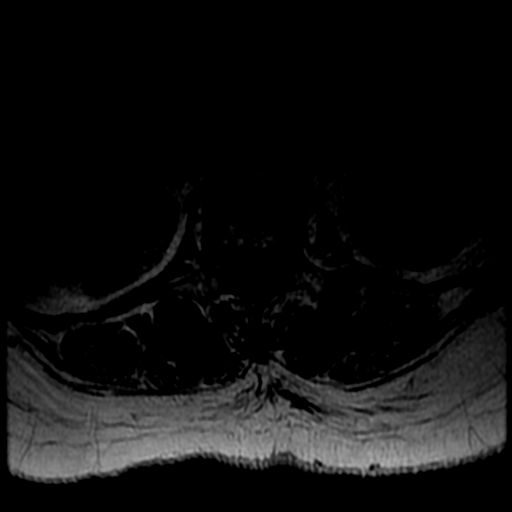

[Series 9: T2 · axial · 4.0mm · 0.39mm/px · z∈[-11,+158]mm · 5 of 36 slices shown]
[im 1/36]
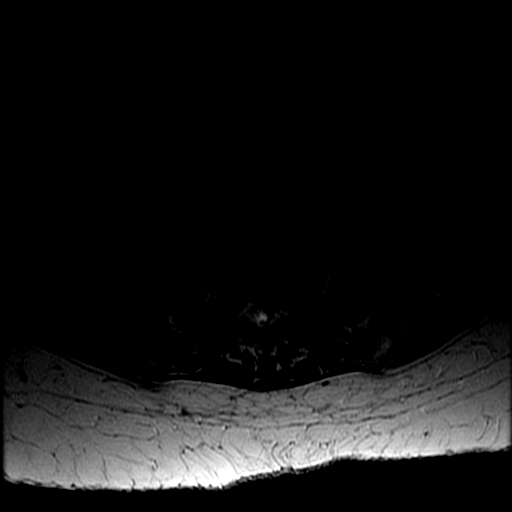
[im 6/36]
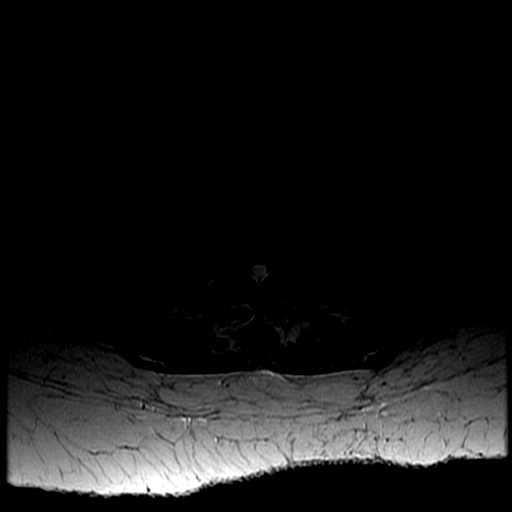
[im 11/36]
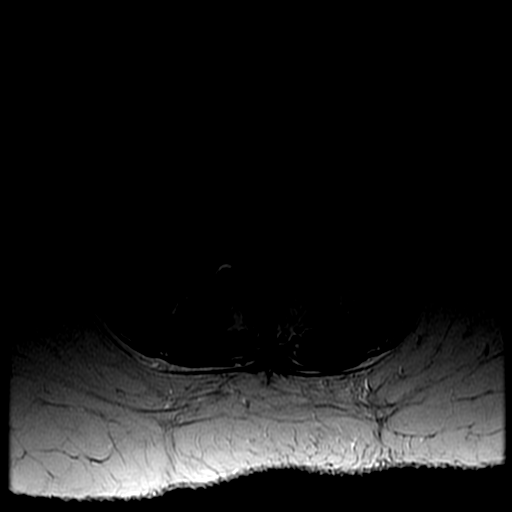
[im 18/36]
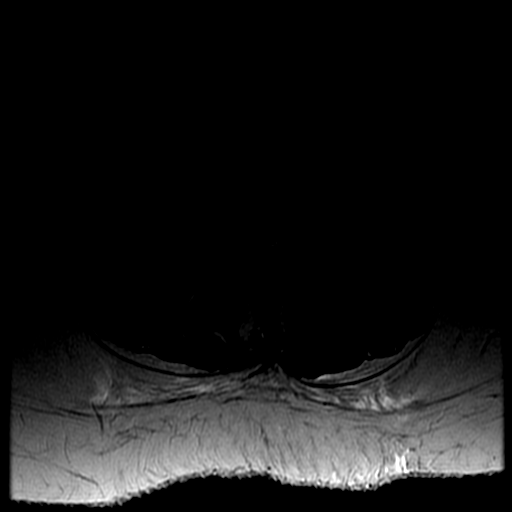
[im 31/36]
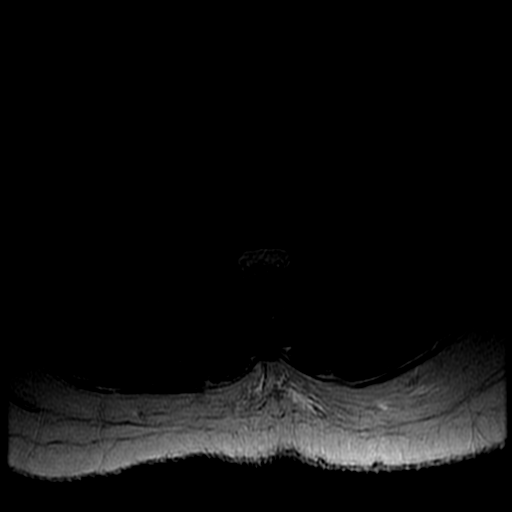

[17 of 48 positions shown; findings below may reference images not displayed]

FINDINGS: Segmentation: Lumbar segmentation appears to be normal and will be
designated as such for this report, which is the same numbering
system used in 1554.

Alignment: Grade 1 anterolisthesis of L3 on L4 is new since 1554,
measuring 4-5 mm. Otherwise stable and normal vertebral height and
alignment.

Vertebrae: No marrow edema or evidence of acute osseous abnormality.
Visible sacrum and SI joints intact.

Conus medullaris: Extends to the T12-L1 level and appears normal.

Paraspinal and other soft tissues: Evidence of a 3 cm gallstone in
the neck of the gallbladder on series 9, image 8. Otherwise negative
visualized abdominal viscera (chronic benign appearing renal cysts).
Negative visualized pelvic viscera. Negative visualized posterior
paraspinal soft tissues.

Disc levels:

T11-T12:  Mild facet hypertrophy.

T12-L1:  Mild to moderate facet hypertrophy.

L1-L2: Mild circumferential disc bulge. Mild to moderate facet and
ligament flavum hypertrophy. New facet joint fluid. Mild left L1
foraminal stenosis.

L2-L3: Increased and now right eccentric circumferential disc bulge
with endplate spurring. Moderate facet and ligament flavum
hypertrophy. New right facet joint fluid. New mild spinal stenosis
and mild to moderate right lateral recess stenosis. Increased right
greater than left mild L2 foraminal stenosis.

L3-L4: Severe disc space loss and grade 1 anterolisthesis since
1554. Bulky right eccentric circumferential disc osteophyte complex.
Broad-based posterior component. Progressed and severe facet
hypertrophy worse on the right. Progressed moderate ligament flavum
hypertrophy. Increased and severe spinal and right greater than left
lateral recess stenosis. Increased moderate right L3 foraminal
stenosis.

L4-L5: Increased left eccentric mostly far lateral disc osteophyte
complex. Broad-based posterior component. Progressed moderate facet
and severe ligament flavum hypertrophy. Chronic facet joint fluid.
Progressed moderate to severe spinal and left lateral recess
stenosis. New mild to moderate right lateral recess stenosis.
Progressed moderate to severe left L4 foraminal stenosis.

L5-S1: Progressed right eccentric and far lateral disc osteophyte
complex. Broad-based posterior component. Chronic moderate facet
hypertrophy appears not significantly changed along with mild
ligament flavum hypertrophy. Mild to moderate bilateral lateral
recess stenosis has increased and is greater on the right.
Borderline to mild spinal stenosis is new. No foraminal stenosis.
IMPRESSION: 1. No acute osseous abnormality identified. Development of grade 1
spondylolisthesis at L3-L4 since [DATE]. Progressed lumbar spine degeneration since 1554. Increased and
now severe multifactorial spinal and right greater than left lateral
recess stenosis at L3-L4. Increased and now moderate to severe
spinal, left lateral recess, and foraminal stenosis at L4-L5. New
mild spinal stenosis at L2-L3 and L5-S1.
3. Three cm gallstone suspected in the gallbladder.

## 2018-03-30 DIAGNOSIS — J069 Acute upper respiratory infection, unspecified: Secondary | ICD-10-CM | POA: Diagnosis not present

## 2018-06-20 ENCOUNTER — Ambulatory Visit: Payer: Medicare Other | Admitting: Podiatry

## 2018-07-07 ENCOUNTER — Ambulatory Visit: Payer: Medicare Other | Admitting: Podiatry

## 2018-08-25 ENCOUNTER — Other Ambulatory Visit: Payer: Self-pay

## 2018-08-25 ENCOUNTER — Encounter: Payer: Self-pay | Admitting: Podiatry

## 2018-08-25 ENCOUNTER — Ambulatory Visit (INDEPENDENT_AMBULATORY_CARE_PROVIDER_SITE_OTHER): Payer: Medicare Other | Admitting: Podiatry

## 2018-08-25 DIAGNOSIS — B351 Tinea unguium: Secondary | ICD-10-CM | POA: Insufficient documentation

## 2018-08-25 DIAGNOSIS — M79675 Pain in left toe(s): Secondary | ICD-10-CM

## 2018-08-25 DIAGNOSIS — M79674 Pain in right toe(s): Secondary | ICD-10-CM | POA: Diagnosis not present

## 2018-08-25 NOTE — Progress Notes (Signed)
Patient ID: Kerry Garcia, female   DOB: 28-May-1925, 85 y.o.   MRN: [id number] Complaint:  Visit Type: Patient returns to my office for continued preventative foot care services. Complaint: Patient states" my nails have grown long and thick and become painful to walk and wear shoes" . The patient presents for preventative foot care services. No changes to ROS.  Patient has not been seen for over 7 months.  Podiatric Exam: Vascular: dorsalis pedis pulses are palpable bilateral. Her posterior tibial pulses are non palpable. . Capillary return is immediate. Temperature gradient is WNL. Skin turgor WNL  Sensorium: Normal Semmes Weinstein monofilament test. Normal tactile sensation bilaterally. Nail Exam: Pt has thick disfigured discolored nails with subungual debris noted bilateral entire nail hallux through fifth toenails Ulcer Exam: There is no evidence of ulcer or pre-ulcerative changes or infection. Orthopedic Exam: Muscle tone and strength are WNL. No limitations in general ROM. No crepitus or effusions noted. Foot type and digits show no abnormalities. Bony prominences are unremarkable. Skin: No Porokeratosis. No infection or ulcers  Diagnosis:  Onychomycosis, , Pain in right toes, pain in left toes  Treatment & Plan Procedures and Treatment: Consent by patient was obtained for treatment procedures. The patient understood the discussion of treatment and procedures well. All questions were answered thoroughly reviewed. Debridement of mycotic and hypertrophic toenails, 1 through 5 bilateral and clearing of subungual debris. No ulceration, no infection noted.  Return Visit-Office Procedure: Patient instructed to return to the office for a follow up visit 3 months for continued evaluation and treatment.    Gardiner Barefoot DPM

## 2018-09-05 ENCOUNTER — Other Ambulatory Visit: Payer: Self-pay | Admitting: *Deleted

## 2018-09-05 DIAGNOSIS — Z20822 Contact with and (suspected) exposure to covid-19: Secondary | ICD-10-CM

## 2018-09-09 LAB — NOVEL CORONAVIRUS, NAA: SARS-CoV-2, NAA: NOT DETECTED

## 2018-12-01 ENCOUNTER — Ambulatory Visit (INDEPENDENT_AMBULATORY_CARE_PROVIDER_SITE_OTHER): Payer: Medicare Other | Admitting: Podiatry

## 2018-12-01 ENCOUNTER — Encounter: Payer: Self-pay | Admitting: Podiatry

## 2018-12-01 ENCOUNTER — Other Ambulatory Visit: Payer: Self-pay

## 2018-12-01 DIAGNOSIS — M79674 Pain in right toe(s): Secondary | ICD-10-CM

## 2018-12-01 DIAGNOSIS — I739 Peripheral vascular disease, unspecified: Secondary | ICD-10-CM | POA: Diagnosis not present

## 2018-12-01 DIAGNOSIS — M79675 Pain in left toe(s): Secondary | ICD-10-CM

## 2018-12-01 DIAGNOSIS — B351 Tinea unguium: Secondary | ICD-10-CM | POA: Diagnosis not present

## 2018-12-01 NOTE — Progress Notes (Signed)
Patient ID: Kerry Garcia, female   DOB: 10/18/1925, 85 y.o.   MRN: [id number] Complaint:  Visit Type: Patient returns to my office for continued preventative foot care services. Complaint: Patient states" my nails have grown long and thick and become painful to walk and wear shoes" . The patient presents for preventative foot care services. No changes to ROS.  Patient has not been seen for over 7 months.  Podiatric Exam: Vascular: dorsalis pedis pulses are palpable bilateral. Her posterior tibial pulses are non palpable. . Capillary return is immediate. Temperature gradient is WNL. Skin turgor WNL  Sensorium: Normal Semmes Weinstein monofilament test. Normal tactile sensation bilaterally. Nail Exam: Pt has thick disfigured discolored nails with subungual debris noted bilateral entire nail hallux through fifth toenails Ulcer Exam: There is no evidence of ulcer or pre-ulcerative changes or infection. Orthopedic Exam: Muscle tone and strength are WNL. No limitations in general ROM. No crepitus or effusions noted. Foot type and digits show no abnormalities. Bony prominences are unremarkable. Skin: No Porokeratosis. No infection or ulcers  Diagnosis:  Onychomycosis, , Pain in right toes, pain in left toes  Treatment & Plan Procedures and Treatment: Consent by patient was obtained for treatment procedures. The patient understood the discussion of treatment and procedures well. All questions were answered thoroughly reviewed. Debridement of mycotic and hypertrophic toenails, 1 through 5 bilateral and clearing of subungual debris. No ulceration, no infection noted.  Return Visit-Office Procedure: Patient instructed to return to the office for a follow up visit 3 months for continued evaluation and treatment.    Preslea Rhodus DPM 

## 2019-03-02 ENCOUNTER — Emergency Department (HOSPITAL_COMMUNITY): Payer: Medicare Other

## 2019-03-02 ENCOUNTER — Ambulatory Visit: Payer: Medicare Other | Admitting: Podiatry

## 2019-03-02 ENCOUNTER — Emergency Department (HOSPITAL_COMMUNITY)
Admission: EM | Admit: 2019-03-02 | Discharge: 2019-03-02 | Disposition: A | Discharge status: Home / Self Care | Payer: Medicare Other | Attending: Emergency Medicine | Admitting: Emergency Medicine

## 2019-03-02 ENCOUNTER — Other Ambulatory Visit: Payer: Self-pay

## 2019-03-02 ENCOUNTER — Encounter (HOSPITAL_COMMUNITY): Payer: Self-pay | Admitting: Emergency Medicine

## 2019-03-02 DIAGNOSIS — Z79899 Other long term (current) drug therapy: Secondary | ICD-10-CM | POA: Insufficient documentation

## 2019-03-02 DIAGNOSIS — U071 COVID-19: Secondary | ICD-10-CM | POA: Insufficient documentation

## 2019-03-02 DIAGNOSIS — E039 Hypothyroidism, unspecified: Secondary | ICD-10-CM | POA: Diagnosis not present

## 2019-03-02 DIAGNOSIS — R509 Fever, unspecified: Secondary | ICD-10-CM | POA: Diagnosis present

## 2019-03-02 DIAGNOSIS — I10 Essential (primary) hypertension: Secondary | ICD-10-CM | POA: Diagnosis not present

## 2019-03-02 LAB — CBC WITH DIFFERENTIAL/PLATELET
Abs Immature Granulocytes: 0.01 10*3/uL (ref 0.00–0.07)
Basophils Absolute: 0 10*3/uL (ref 0.0–0.1)
Basophils Relative: 0 %
Eosinophils Absolute: 0 10*3/uL (ref 0.0–0.5)
Eosinophils Relative: 0 %
HCT: 37.7 % (ref 36.0–46.0)
Hemoglobin: 11.7 g/dL — ABNORMAL LOW (ref 12.0–15.0)
Immature Granulocytes: 0 %
Lymphocytes Relative: 13 %
Lymphs Abs: 0.6 10*3/uL — ABNORMAL LOW (ref 0.7–4.0)
MCH: 26.1 pg (ref 26.0–34.0)
MCHC: 31 g/dL (ref 30.0–36.0)
MCV: 84 fL (ref 80.0–100.0)
Monocytes Absolute: 0.5 10*3/uL (ref 0.1–1.0)
Monocytes Relative: 10 %
Neutro Abs: 3.8 10*3/uL (ref 1.7–7.7)
Neutrophils Relative %: 77 %
Platelets: 143 10*3/uL — ABNORMAL LOW (ref 150–400)
RBC: 4.49 MIL/uL (ref 3.87–5.11)
RDW: 13.8 % (ref 11.5–15.5)
WBC: 4.9 10*3/uL (ref 4.0–10.5)
nRBC: 0 % (ref 0.0–0.2)

## 2019-03-02 LAB — COMPREHENSIVE METABOLIC PANEL
ALT: 28 U/L (ref 0–44)
AST: 41 U/L (ref 15–41)
Albumin: 3.6 g/dL (ref 3.5–5.0)
Alkaline Phosphatase: 86 U/L (ref 38–126)
Anion gap: 9 (ref 5–15)
BUN: 13 mg/dL (ref 8–23)
CO2: 29 mmol/L (ref 22–32)
Calcium: 7.9 mg/dL — ABNORMAL LOW (ref 8.9–10.3)
Chloride: 91 mmol/L — ABNORMAL LOW (ref 98–111)
Creatinine, Ser: 0.82 mg/dL (ref 0.44–1.00)
GFR calc Af Amer: >60 mL/min (ref >60)
GFR calc non Af Amer: >60 mL/min (ref >60)
Glucose, Bld: 100 mg/dL — ABNORMAL HIGH (ref 70–99)
Potassium: 3.7 mmol/L (ref 3.5–5.1)
Sodium: 129 mmol/L — ABNORMAL LOW (ref 135–145)
Total Bilirubin: 0.7 mg/dL (ref 0.3–1.2)
Total Protein: 6.7 g/dL (ref 6.5–8.1)

## 2019-03-02 LAB — URINALYSIS, ROUTINE W REFLEX MICROSCOPIC
Bilirubin Urine: NEGATIVE
Glucose, UA: NEGATIVE mg/dL
Hgb urine dipstick: NEGATIVE
Ketones, ur: NEGATIVE mg/dL
Leukocytes,Ua: NEGATIVE
Nitrite: NEGATIVE
Protein, ur: 30 mg/dL — AB
Specific Gravity, Urine: 1.024 (ref 1.005–1.030)
pH: 5 (ref 5.0–8.0)

## 2019-03-02 LAB — POC SARS CORONAVIRUS 2 AG -  ED: SARS Coronavirus 2 Ag: POSITIVE — AB

## 2019-03-02 LAB — LACTIC ACID, PLASMA: Lactic Acid, Venous: 1.2 mmol/L (ref 0.5–1.9)

## 2019-03-02 MED ORDER — SODIUM CHLORIDE 0.9 % IV BOLUS: 500.0000 mL | Freq: Once | INTRAVENOUS | Status: DC

## 2019-03-02 MED ORDER — ACETAMINOPHEN 325 MG PO TABS
650.0000 mg | ORAL_TABLET | Freq: Once | ORAL | Status: AC
  Administered 2019-03-02: 650 mg via ORAL
  Filled 2019-03-02: qty 2

## 2019-03-02 NOTE — ED Provider Notes (Signed)
[facility] DEPT Provider Note  CSN: [id number] Arrival date & time: 03/02/19 0414  Chief Complaint(s) Weakness and Altered Mental Status  HPI Kerry Garcia is a 85 y.o. female who presents to the emergency department by EMS after they were called out for mechanical fall.  Patient lives with son who was recently admitted with COVID-19.  He was discharged yesterday.  Patient's son reports that the patient has not had much to eat or drink today.  He reports that while attempting to walk to the bathroom with her walker, she stopped to prevent herself from using the restroom, lost her balance and fell backwards landing on her buttocks.  There was no head trauma or loss of consciousness.  She did not complain of any immediate pain.  He reports that she tested positive for COVID-19 on December 13.  While he was admitted to the hospital, family and friends would come by to check on the patient to make sure she was doing okay.  He denies any known fevers, cough, congestion, nausea, vomiting, diarrhea, abdominal pain.  He reports that she has had similar episodes in the past with urinary tract infections.  Patient denies any physical complaints.  HPI  Past Medical History Past Medical History:  Diagnosis Date  . Carpal tunnel syndrome   . Depression   . Fibromyalgia   . Hypertension   . Hypothyroidism   . Incontinence   . Spinal stenosis    Patient Active Problem List   Diagnosis Date Noted  . Pain due to onychomycosis of toenails of both feet 08/25/2018  . Primary osteoarthritis of left hand 08/27/2016  . Primary osteoarthritis of both first carpometacarpal joints 03/26/2016  . Carpal tunnel syndrome on left 01/02/2016  . Left hand pain 12/10/2015  . Hypertension   . Incontinence   . Multinodular goiter   . Depression   . CLAUDICATION 12/02/2009  . CHEST PAIN 12/02/2009   Home Medication(s) Prior to Admission medications   Medication Sig Start  Date End Date Taking? Authorizing Provider  Acetaminophen (TYLENOL ARTHRITIS PAIN PO) Take 1 tablet by mouth every 6 (six) hours as needed (Pain).     [provider]  celecoxib (CELEBREX) 200 MG capsule TAKE 1 CAPSULE BY MOUTH EVERY DAY WITH FOOD 08/14/18   [provider]  COMBIGAN 0.2-0.5 % ophthalmic solution INSTILL 1 DROP IN RIGHT EYE TWICE A DAY 06/22/18   [provider]  DULoxetine (CYMBALTA) 30 MG capsule Take 30 mg by mouth daily.      [provider]  escitalopram (LEXAPRO) 20 MG tablet TAKE 1 TABLET BY MOUTH EVERY DAY 05/31/16   [provider]  meclizine (ANTIVERT) 12.5 MG tablet NOT TAKING 09/29/15   [provider]  triamterene-hydrochlorothiazide (MAXZIDE-25) 37.5-25 MG tablet Take 1 tablet by mouth every morning. 06/25/18   [provider]  Past Surgical History Past Surgical History:  Procedure Laterality Date  . BREAST LUMPECTOMY  1965  . SPINE SURGERY     Family History Family History  Problem Relation Age of Onset  . Heart failure Father   . Prostate cancer Son     Social History Social History   Tobacco Use  . Smoking status: Never Smoker  . Smokeless tobacco: Never Used  Substance Use Topics  . Alcohol use: No  . Drug use: No   Allergies Ibuprofen, Fish allergy, and Other  Review of Systems Review of Systems All other systems are reviewed and are negative for acute change except as noted in the HPI  Physical Exam Vital Signs  I have reviewed the triage vital signs BP (!) 131/59 (BP Location: Right Arm)   Pulse 80   Temp (!) 101.4 F (38.6 C) (Oral)   Resp (!) 28   LMP 03/16/1963   SpO2 100%   Physical Exam Vitals reviewed.  Constitutional:      General: She is not in acute distress.    Appearance: She is well-developed. She is not diaphoretic.  HENT:      Head: Normocephalic and atraumatic.     Right Ear: External ear normal.     Left Ear: External ear normal.     Nose: Nose normal.  Eyes:     General: No scleral icterus.    Conjunctiva/sclera: Conjunctivae normal.  Neck:     Trachea: Phonation normal.  Cardiovascular:     Rate and Rhythm: Normal rate and regular rhythm.  Pulmonary:     Effort: Pulmonary effort is normal. No respiratory distress.     Breath sounds: No stridor.  Abdominal:     General: There is no distension.     Tenderness: There is no abdominal tenderness.  Musculoskeletal:        General: Normal range of motion.     Cervical back: Normal range of motion.  Neurological:     Mental Status: She is alert and oriented to person, place, and time.  Psychiatric:        Behavior: Behavior normal.     ED Results and Treatments Labs (all labs ordered are listed, but only abnormal results are displayed) Labs Reviewed  CBC WITH DIFFERENTIAL/PLATELET - Abnormal; Notable for the following components:      Result Value   Hemoglobin 11.7 (*)    Platelets 143 (*)    Lymphs Abs 0.6 (*)    All other components within normal limits  COMPREHENSIVE METABOLIC PANEL - Abnormal; Notable for the following components:   Sodium 129 (*)    Chloride 91 (*)    Glucose, Bld 100 (*)    Calcium 7.9 (*)    All other components within normal limits  URINALYSIS, ROUTINE W REFLEX MICROSCOPIC - Abnormal; Notable for the following components:   Color, Urine AMBER (*)    Protein, ur 30 (*)    Bacteria, UA RARE (*)    All other components within normal limits  POC SARS CORONAVIRUS 2 AG -  ED - Abnormal; Notable for the following components:   SARS Coronavirus 2 Ag POSITIVE (*)    All other components within normal limits  LACTIC ACID, PLASMA  LACTIC ACID, PLASMA  EKG  EKG Interpretation  Date/Time:    Ventricular  Rate:    PR Interval:    QRS Duration:   QT Interval:    QTC Calculation:   R Axis:     Text Interpretation:        Radiology CXR Port 1 View - chest pain  Result Date: 03/02/2019 CLINICAL DATA:  Initial evaluation for acute fever, history of COVID infection. EXAM: PORTABLE CHEST 1 VIEW COMPARISON:  Prior radiograph from 01/18/2016. FINDINGS: Mild cardiomegaly. Mediastinal silhouette within normal limits. Aortic atherosclerosis. Lungs normally inflated. Patchy and hazy interstitial opacities seen involving the left mid and bilateral lower lobes, likely reflecting acute viral pneumonitis/Coban pneumonia. No pulmonary edema or pleural effusion. Calcified left perihilar lymph node noted. No pneumothorax. No acute osseous finding. IMPRESSION: 1. Patchy and hazy interstitial opacities involving the left mid and bilateral lower lobes, likely reflecting acute viral pneumonitis/COVID pneumonia. 2. Mild cardiomegaly without pulmonary edema. 3.  Aortic Atherosclerosis (ICD10-I70.0). Electronically Signed   By: Jeannine Boga M.D.   On: 03/02/2019 05:38    Pertinent labs & imaging results that were available during my care of the patient were reviewed by me and considered in my medical decision making (see chart for details).  Medications Ordered in ED Medications  sodium chloride 0.9 % bolus 500 mL (has no administration in time range)  acetaminophen (TYLENOL) tablet 650 mg (650 mg Oral Given 03/02/19 0533)                                                                                                                                    Procedures Procedures  (including critical care time)  Medical Decision Making / ED Course I have reviewed the nursing notes for this encounter and the patient's prior records (if available in EHR or on provided paperwork).   Sunjai MYLINH BILTON was evaluated in Emergency Department on 03/02/2019 for the symptoms described in the history of present  illness. She was evaluated in the context of the global COVID-19 pandemic, which necessitated consideration that the patient might be at risk for infection with the SARS-CoV-2 virus that causes COVID-19. Institutional protocols and algorithms that pertain to the evaluation of patients at risk for COVID-19 are in a state of rapid change based on information released by regulatory bodies including the CDC and federal and state organizations. These policies and algorithms were followed during the patient's care in the ED.  Known covid + patient here with generalized fatigue, mechanical fall not resulting in injury.  Labs w/o leukocytosis or significant anemia.  Mild hyponatremia and hypochloremia.  Otherwise no significant electrolyte derangements or renal sufficiency.  Lactic acid negative.  Patient is otherwise well-appearing and well-hydrated.  She is nontoxic.  No hypoxia.  UA negative.  The patient appears reasonably screened and/or stabilized for discharge and I doubt any other medical condition or other [facility] requiring further screening, evaluation, or treatment in the ED  at this time prior to discharge.  The patient is safe for discharge with strict return precautions.       Final Clinical Impression(s) / ED Diagnoses Final diagnoses:  Fever  COVID-19 virus infection      The patient appears reasonably screened and/or stabilized for discharge and I doubt any other medical condition or other [facility] requiring further screening, evaluation, or treatment in the ED at this time prior to discharge.  Disposition: Discharge  Condition: Good  I have discussed the results, Dx and Tx plan with the patient's son who expressed understanding and agree(s) with the plan. Discharge instructions discussed at great length. The patient's son was given strict return precautions who verbalized understanding of the instructions. No further questions at time of discharge.    ED Discharge Orders    None        Follow Up: [name], MD [place] [employer] [place] [address] [place] [address] [phone]  Call  As needed    This chart was dictated using voice recognition software.  Despite best efforts to proofread,  errors can occur which can change the documentation meaning.   Fatima Blank, MD 03/02/19 [phone]

## 2019-03-02 NOTE — ED Triage Notes (Addendum)
Pt from home came to ED for increasing generalized weakness and confusion pt lives with son who was just discharge from hospital with Dx of COVID. Intial call to EMS for a fall from standing that was assisted to the floor denies pain or injury no deformity or injury noted on examination. Pt was tested at [facility] on this Monday with a positive result

## 2019-03-06 ENCOUNTER — Telehealth: Payer: Self-pay | Admitting: Infectious Diseases

## 2019-03-06 NOTE — Telephone Encounter (Signed)
Called to discuss with patient's son and caregiver about Covid symptoms and the use of bamlanivimab, a monoclonal antibody infusion for those with mild to moderate Covid symptoms and at a high risk of hospitalization.  Pt is qualified for this infusion at the [facility] infusion center due to Age > 42.    Message left to call back. With review of chart it is unclear as to when her symptoms started and will need further clarification for candidacy.    Janene Madeira, MSN, NP-C [facility] [email or web address] Pager: [phone] Office: [place]: [phone]

## 2019-05-04 ENCOUNTER — Ambulatory Visit: Payer: Medicare Other | Admitting: Podiatry

## 2019-05-09 ENCOUNTER — Ambulatory Visit (INDEPENDENT_AMBULATORY_CARE_PROVIDER_SITE_OTHER): Payer: Medicare Other | Admitting: Podiatry

## 2019-05-09 ENCOUNTER — Encounter: Payer: Self-pay | Admitting: Podiatry

## 2019-05-09 ENCOUNTER — Other Ambulatory Visit: Payer: Self-pay

## 2019-05-09 DIAGNOSIS — M79675 Pain in left toe(s): Secondary | ICD-10-CM | POA: Diagnosis not present

## 2019-05-09 DIAGNOSIS — M79674 Pain in right toe(s): Secondary | ICD-10-CM | POA: Diagnosis not present

## 2019-05-09 DIAGNOSIS — I739 Peripheral vascular disease, unspecified: Secondary | ICD-10-CM | POA: Diagnosis not present

## 2019-05-09 DIAGNOSIS — B351 Tinea unguium: Secondary | ICD-10-CM

## 2019-05-09 NOTE — Progress Notes (Signed)
Patient ID: Kerry Garcia, female   DOB: 02/02/1926, 86 y.o.   MRN: [id number] Complaint:  Visit Type: Patient returns to my office for continued preventative foot care services. Complaint: Patient states" my nails have grown long and thick and become painful to walk and wear shoes" . The patient presents for preventative foot care services. No changes to ROS.  Patient has not been seen for over 5 months.  Podiatric Exam: Vascular: dorsalis pedis pulses are palpable bilateral. Her posterior tibial pulses are non palpable. . Capillary return is immediate. Temperature gradient is WNL. Skin turgor WNL  Sensorium: Normal Semmes Weinstein monofilament test. Normal tactile sensation bilaterally. Nail Exam: Pt has thick disfigured discolored nails with subungual debris noted bilateral entire nail hallux through fifth toenails Ulcer Exam: There is no evidence of ulcer or pre-ulcerative changes or infection. Orthopedic Exam: Muscle tone and strength are WNL. No limitations in general ROM. No crepitus or effusions noted. Foot type and digits show no abnormalities. Bony prominences are unremarkable. Skin: No Porokeratosis. No infection or ulcers  Diagnosis:  Onychomycosis, , Pain in right toes, pain in left toes  Treatment & Plan Procedures and Treatment: Consent by patient was obtained for treatment procedures. The patient understood the discussion of treatment and procedures well. All questions were answered thoroughly reviewed. Debridement of mycotic and hypertrophic toenails, 1 through 5 bilateral and clearing of subungual debris. No ulceration, no infection noted.  Return Visit-Office Procedure: Patient instructed to return to the office for a follow up visit 3 months for continued evaluation and treatment.    Gardiner Barefoot DPM

## 2019-05-20 ENCOUNTER — Emergency Department (HOSPITAL_COMMUNITY): Payer: Medicare Other

## 2019-05-20 ENCOUNTER — Emergency Department (HOSPITAL_COMMUNITY)
Admission: EM | Admit: 2019-05-20 | Discharge: 2019-05-20 | Disposition: A | Discharge status: Home / Self Care | Payer: Medicare Other | Attending: Emergency Medicine | Admitting: Emergency Medicine

## 2019-05-20 ENCOUNTER — Other Ambulatory Visit: Payer: Self-pay

## 2019-05-20 ENCOUNTER — Encounter (HOSPITAL_COMMUNITY): Payer: Self-pay | Admitting: *Deleted

## 2019-05-20 DIAGNOSIS — R531 Weakness: Secondary | ICD-10-CM | POA: Diagnosis not present

## 2019-05-20 DIAGNOSIS — R0789 Other chest pain: Secondary | ICD-10-CM | POA: Insufficient documentation

## 2019-05-20 DIAGNOSIS — F0390 Unspecified dementia without behavioral disturbance: Secondary | ICD-10-CM | POA: Diagnosis not present

## 2019-05-20 DIAGNOSIS — E860 Dehydration: Secondary | ICD-10-CM | POA: Diagnosis not present

## 2019-05-20 DIAGNOSIS — Z79899 Other long term (current) drug therapy: Secondary | ICD-10-CM | POA: Diagnosis not present

## 2019-05-20 DIAGNOSIS — I10 Essential (primary) hypertension: Secondary | ICD-10-CM | POA: Insufficient documentation

## 2019-05-20 DIAGNOSIS — E039 Hypothyroidism, unspecified: Secondary | ICD-10-CM | POA: Diagnosis not present

## 2019-05-20 HISTORY — DX: Unspecified dementia, unspecified severity, without behavioral disturbance, psychotic disturbance, mood disturbance, and anxiety: F03.90

## 2019-05-20 LAB — CBC
HCT: 37.2 % (ref 36.0–46.0)
Hemoglobin: 11.4 g/dL — ABNORMAL LOW (ref 12.0–15.0)
MCH: 26.6 pg (ref 26.0–34.0)
MCHC: 30.6 g/dL (ref 30.0–36.0)
MCV: 86.9 fL (ref 80.0–100.0)
Platelets: 217 10*3/uL (ref 150–400)
RBC: 4.28 MIL/uL (ref 3.87–5.11)
RDW: 14.9 % (ref 11.5–15.5)
WBC: 6.9 10*3/uL (ref 4.0–10.5)
nRBC: 0 % (ref 0.0–0.2)

## 2019-05-20 LAB — BASIC METABOLIC PANEL
Anion gap: 8 (ref 5–15)
BUN: 23 mg/dL (ref 8–23)
CO2: 30 mmol/L (ref 22–32)
Calcium: 9 mg/dL (ref 8.9–10.3)
Chloride: 104 mmol/L (ref 98–111)
Creatinine, Ser: 0.75 mg/dL (ref 0.44–1.00)
GFR calc Af Amer: >60 mL/min (ref >60)
GFR calc non Af Amer: >60 mL/min (ref >60)
Glucose, Bld: 118 mg/dL — ABNORMAL HIGH (ref 70–99)
Potassium: 3.7 mmol/L (ref 3.5–5.1)
Sodium: 142 mmol/L (ref 135–145)

## 2019-05-20 LAB — URINALYSIS, ROUTINE W REFLEX MICROSCOPIC
Bilirubin Urine: NEGATIVE
Glucose, UA: NEGATIVE mg/dL
Hgb urine dipstick: NEGATIVE
Ketones, ur: NEGATIVE mg/dL
Leukocytes,Ua: NEGATIVE
Nitrite: NEGATIVE
Protein, ur: NEGATIVE mg/dL
Specific Gravity, Urine: 1.027 (ref 1.005–1.030)
pH: 5 (ref 5.0–8.0)

## 2019-05-20 LAB — TROPONIN I (HIGH SENSITIVITY)
Troponin I (High Sensitivity): 6 ng/L (ref <18)
Troponin I (High Sensitivity): 5 ng/L (ref <18)

## 2019-05-20 LAB — CBG MONITORING, ED: Glucose-Capillary: 91 mg/dL (ref 70–99)

## 2019-05-20 MED ORDER — SODIUM CHLORIDE 0.9% FLUSH: 3.0000 mL | Freq: Once | INTRAVENOUS | Status: DC

## 2019-05-20 MED ORDER — SODIUM CHLORIDE 0.9 % IV BOLUS
500.0000 mL | Freq: Once | INTRAVENOUS | Status: AC
  Administered 2019-05-20: 500 mL via INTRAVENOUS

## 2019-05-20 NOTE — ED Notes (Signed)
Discharge paperwork reviewed with pt and son. Pt assisted to dress, wheeled to meet son outside of ED entrance.

## 2019-05-20 NOTE — ED Notes (Signed)
[name], [phone], son, would like a call with an update.

## 2019-05-20 NOTE — ED Provider Notes (Signed)
[facility] DEPT Provider Note   CSN: [id number] Arrival date & time: 05/20/19  1247     History Chief Complaint  Patient presents with  . Chest Pain  . Weakness    Kerry Garcia is a 86 y.o. female.  She is presenting complaining of some chest pressure that started last evening.  It was associated with some generalized weakness.  She said it was there again this morning and she had fluid come out of her ears.  She went to an urgent care and they sent her here for further evaluation.  She said since she has been resting here her symptoms have improved.  Denies any headache blurry vision cough sore throat nausea vomiting diarrhea or urinary symptoms.  She has some mild shortness of breath.  Chest pain worse with movement, not pleuritic.  The history is provided by the patient.  Chest Pain Pain location:  L chest and R chest Pain quality: pressure   Pain radiates to:  Does not radiate Pain severity:  Moderate Onset quality:  Gradual Duration:  20 hours Timing:  Intermittent Progression:  Improving Chronicity:  Recurrent Context: at rest   Relieved by:  None tried Worsened by:  Movement and certain positions Ineffective treatments:  None tried Associated symptoms: shortness of breath and weakness   Associated symptoms: no abdominal pain, no altered mental status, no back pain, no cough, no diaphoresis, no fever, no headache, no near-syncope, no numbness, no palpitations and no syncope   Risk factors: hypertension   Weakness Associated symptoms: chest pain and shortness of breath   Associated symptoms: no abdominal pain, no cough, no dysuria, no fever, no headaches, no near-syncope and no syncope        Past Medical History:  Diagnosis Date  . Carpal tunnel syndrome   . Dementia ([place])   . Depression   . Fibromyalgia   . Hypertension   . Hypothyroidism   . Incontinence   . Spinal stenosis     Patient Active Problem List   Diagnosis  Date Noted  . Pain due to onychomycosis of toenails of both feet 08/25/2018  . Primary osteoarthritis of left hand 08/27/2016  . Primary osteoarthritis of both first carpometacarpal joints 03/26/2016  . Carpal tunnel syndrome on left 01/02/2016  . Left hand pain 12/10/2015  . Hypertension   . Incontinence   . Multinodular goiter   . Depression   . CLAUDICATION 12/02/2009  . CHEST PAIN 12/02/2009    Past Surgical History:  Procedure Laterality Date  . BREAST LUMPECTOMY  1965  . SPINE SURGERY       OB History    Gravida  6   Para  5   Term      Preterm      AB  1   Living  4     SAB      TAB      Ectopic      Multiple      Live Births              Family History  Problem Relation Age of Onset  . Heart failure Father   . Prostate cancer Son     Social History   Tobacco Use  . Smoking status: Never Smoker  . Smokeless tobacco: Never Used  Substance Use Topics  . Alcohol use: No  . Drug use: No    Home Medications Prior to Admission medications   Medication Sig Start Date End  Date Taking? Authorizing Provider  Acetaminophen (TYLENOL ARTHRITIS PAIN PO) Take 1 tablet by mouth every 6 (six) hours as needed (Pain).     [provider]  celecoxib (CELEBREX) 200 MG capsule TAKE 1 CAPSULE BY MOUTH EVERY DAY WITH FOOD 08/14/18   [provider]  COMBIGAN 0.2-0.5 % ophthalmic solution INSTILL 1 DROP IN RIGHT EYE TWICE A DAY 06/22/18   [provider]  DULoxetine (CYMBALTA) 30 MG capsule Take 30 mg by mouth daily.      [provider]  escitalopram (LEXAPRO) 20 MG tablet TAKE 1 TABLET BY MOUTH EVERY DAY 05/31/16   [provider]  meclizine (ANTIVERT) 12.5 MG tablet NOT TAKING 09/29/15   [provider]  triamterene-hydrochlorothiazide (MAXZIDE-25) 37.5-25 MG tablet Take 1 tablet by mouth every morning. 06/25/18   [provider]    Allergies    Ibuprofen, Fish allergy, and Other  Review of  Systems   Review of Systems  Constitutional: Negative for diaphoresis and fever.  HENT: Negative for sore throat.   Eyes: Negative for visual disturbance.  Respiratory: Positive for shortness of breath. Negative for cough.   Cardiovascular: Positive for chest pain. Negative for palpitations, syncope and near-syncope.  Gastrointestinal: Negative for abdominal pain.  Genitourinary: Negative for dysuria.  Musculoskeletal: Negative for back pain.  Skin: Negative for rash.  Neurological: Positive for weakness. Negative for numbness and headaches.    Physical Exam Updated Vital Signs BP (!) 126/56 (BP Location: Right Arm)   Pulse 80   Temp 98.2 F (36.8 C)   Resp 15   Ht 5\' 4"  (1.626 m)   LMP 03/16/1963   SpO2 97%   BMI 34.16 kg/m   Physical Exam Vitals and nursing note reviewed.  Constitutional:      General: She is not in acute distress.    Appearance: She is well-developed.  HENT:     Head: Normocephalic and atraumatic.  Eyes:     Conjunctiva/sclera: Conjunctivae normal.  Cardiovascular:     Rate and Rhythm: Normal rate and regular rhythm.     Heart sounds: Normal heart sounds. No murmur.  Pulmonary:     Effort: Pulmonary effort is normal. No respiratory distress.     Breath sounds: Normal breath sounds.  Abdominal:     Palpations: Abdomen is soft.     Tenderness: There is no abdominal tenderness.  Musculoskeletal:        General: Normal range of motion.     Cervical back: Neck supple.     Right lower leg: No tenderness. No edema.     Left lower leg: No tenderness. No edema.  Skin:    General: Skin is warm and dry.     Capillary Refill: Capillary refill takes less than 2 seconds.  Neurological:     General: No focal deficit present.     Mental Status: She is alert.     ED Results / Procedures / Treatments   Labs (all labs ordered are listed, but only abnormal results are displayed) Labs Reviewed  BASIC METABOLIC PANEL - Abnormal; Notable for the following  components:      Result Value   Glucose, Bld 118 (*)    All other components within normal limits  CBC - Abnormal; Notable for the following components:   Hemoglobin 11.4 (*)    All other components within normal limits  URINALYSIS, ROUTINE W REFLEX MICROSCOPIC - Abnormal; Notable for the following components:   Color, Urine AMBER (*)  APPearance HAZY (*)    All other components within normal limits  CBG MONITORING, ED  TROPONIN I (HIGH SENSITIVITY)  TROPONIN I (HIGH SENSITIVITY)    EKG EKG Interpretation  Date/Time:  Sunday May 20 2019 13:00:21 EST Ventricular Rate:  65 PR Interval:    QRS Duration: 96 QT Interval:  452 QTC Calculation: 470 R Axis:   105 Text Interpretation: Right and left arm electrode reversal, interpretation assumes no reversal Sinus rhythm Right axis deviation Abnormal T, consider ischemia, lateral leads Minimal ST elevation, anterior leads nonspecific st/ts compared with prior 11/17 Confirmed by Aletta Edouard [id number]) on 05/20/2019 3:43:43 PM   Radiology DG Chest 2 View  Result Date: 05/20/2019 CLINICAL DATA:  86 year old with acute onset of LEFT-sided chest pain and generalized weakness. EXAM: CHEST - 2 VIEW COMPARISON:  03/02/2019 and earlier. FINDINGS: Cardiac silhouette normal in size, unchanged. Thoracic aorta minimally atherosclerotic, unchanged. Calcified LEFT hilar lymph nodes, unchanged. Hilar and mediastinal contours otherwise unremarkable. Calcified granuloma in the LEFT UPPER LOBE, unchanged. Lungs otherwise clear. Bronchovascular markings normal. Pulmonary vascularity normal. No visible pleural effusions. No pneumothorax. Thoracolumbar dextroscoliosis. IMPRESSION: No acute cardiopulmonary disease. Old granulomatous disease. Electronically Signed   By: Evangeline Dakin M.D.   On: 05/20/2019 14:29    Procedures Procedures (including critical care time)  Medications Ordered in ED Medications  sodium chloride 0.9 % bolus 500 mL (0 mLs  Intravenous Stopped 05/20/19 1911)    ED Course  I have reviewed the triage vital signs and the nursing notes.  Pertinent labs & imaging results that were available during my care of the patient were reviewed by me and considered in my medical decision making (see chart for details).  Clinical Course as of May 20 1020  Sun May 20, 2019  1601 Differential includes ACS, musculoskeletal, GERD, pneumonia, PE, vascular   [MB]    Clinical Course User Index [MB] Hayden Rasmussen, MD   MDM Rules/Calculators/A&P                     Workup unrevealing. Sx resolved while here. She wants to go home, calling family. Return instructions reviewed.  Final Clinical Impression(s) / ED Diagnoses Final diagnoses:  Atypical chest pain  Dehydration      Rx / DC Orders ED Discharge Orders    None       Hayden Rasmussen, MD 05/21/19 1023

## 2019-05-20 NOTE — Discharge Instructions (Addendum)
You were seen in the emergency department for chest pain, weakness, dizziness.  You had blood work chest x-ray and an EKG that did not show any serious findings.  There is no evidence of urinary tract infection.  Please contact your primary care doctor for close follow-up.  Return to the emergency department if any worsening symptoms.

## 2019-05-20 NOTE — ED Triage Notes (Signed)
Pt states she had weakness and chest pain yesterday that has not subsided.

## 2019-06-09 ENCOUNTER — Ambulatory Visit: Payer: Medicare Other | Attending: Internal Medicine

## 2019-06-09 DIAGNOSIS — Z23 Encounter for immunization: Secondary | ICD-10-CM

## 2019-06-09 NOTE — Progress Notes (Signed)
   Covid-19 Vaccination Clinic  Name:  Kerry Garcia    MRN: [id number] DOB: 07-27-1925  06/09/2019  [name] was observed post Covid-19 immunization for 15 minutes without incident. She was provided with Vaccine Information Sheet and instruction to access the V-Safe system.   [name] was instructed to call 911 with any severe reactions post vaccine: Marland Kitchen Difficulty breathing  . Swelling of face and throat  . A fast heartbeat  . A bad rash all over body  . Dizziness and weakness   Immunizations Administered    Name Date Dose VIS Date Route   Pfizer COVID-19 Vaccine 06/09/2019  9:28 AM 0.3 mL 02/23/2019 Intramuscular   Manufacturer: [place]   Lot: [id number]   [place]: [id number]

## 2019-07-03 ENCOUNTER — Ambulatory Visit: Payer: Medicare Other | Attending: Internal Medicine

## 2019-07-03 DIAGNOSIS — Z23 Encounter for immunization: Secondary | ICD-10-CM

## 2019-07-03 NOTE — Progress Notes (Signed)
   Covid-19 Vaccination Clinic  Name:  MARVINE SHIPE    MRN: [id number] DOB: 1925-08-12  07/03/2019  Ms. Resetar was observed post Covid-19 immunization for 15 minutes without incident. She was provided with Vaccine Information Sheet and instruction to access the V-Safe system.   Ms. Tatis was instructed to call 911 with any severe reactions post vaccine: Marland Kitchen Difficulty breathing  . Swelling of face and throat  . A fast heartbeat  . A bad rash all over body  . Dizziness and weakness   Immunizations Administered    Name Date Dose VIS Date Route   Pfizer COVID-19 Vaccine 07/03/2019 10:08 AM 0.3 mL 05/09/2018 Intramuscular   Manufacturer: [place]   Lot: [id number]   [place]: [id number]

## 2019-08-07 ENCOUNTER — Other Ambulatory Visit: Payer: Self-pay

## 2019-08-07 ENCOUNTER — Ambulatory Visit (INDEPENDENT_AMBULATORY_CARE_PROVIDER_SITE_OTHER): Payer: Medicare Other | Admitting: Podiatry

## 2019-08-07 ENCOUNTER — Encounter: Payer: Self-pay | Admitting: Podiatry

## 2019-08-07 VITALS — Temp 96.9°F

## 2019-08-07 DIAGNOSIS — I739 Peripheral vascular disease, unspecified: Secondary | ICD-10-CM | POA: Diagnosis not present

## 2019-08-07 DIAGNOSIS — M79674 Pain in right toe(s): Secondary | ICD-10-CM | POA: Diagnosis not present

## 2019-08-07 DIAGNOSIS — B351 Tinea unguium: Secondary | ICD-10-CM

## 2019-08-07 DIAGNOSIS — M79675 Pain in left toe(s): Secondary | ICD-10-CM | POA: Diagnosis not present

## 2019-08-07 NOTE — Progress Notes (Signed)

## 2019-11-06 ENCOUNTER — Encounter: Payer: Self-pay | Admitting: Podiatry

## 2019-11-06 ENCOUNTER — Other Ambulatory Visit: Payer: Self-pay

## 2019-11-06 ENCOUNTER — Ambulatory Visit (INDEPENDENT_AMBULATORY_CARE_PROVIDER_SITE_OTHER): Payer: Medicare Other | Admitting: Podiatry

## 2019-11-06 DIAGNOSIS — B351 Tinea unguium: Secondary | ICD-10-CM | POA: Diagnosis not present

## 2019-11-06 DIAGNOSIS — M79675 Pain in left toe(s): Secondary | ICD-10-CM

## 2019-11-06 DIAGNOSIS — M79674 Pain in right toe(s): Secondary | ICD-10-CM | POA: Diagnosis not present

## 2019-11-06 NOTE — Progress Notes (Signed)
Subjective:  Patient ID: Kerry Garcia, female    DOB: 12-06-1925,  MRN: [id number]  [place] presents to clinic today for painful thick toenails that are difficult to trim. Pain interferes with ambulation. Aggravating factors include wearing enclosed shoe gear. Pain is relieved with periodic professional debridement..  86 y.o. female presents with the above complaint.    Review of Systems: Negative except as noted in the HPI. Past Medical History:  Diagnosis Date  . Carpal tunnel syndrome   . Dementia ([place])   . Depression   . Fibromyalgia   . Hypertension   . Hypothyroidism   . Incontinence   . Spinal stenosis    Past Surgical History:  Procedure Laterality Date  . BREAST LUMPECTOMY  1965  . SPINE SURGERY      Current Outpatient Medications:  .  aspirin EC 81 MG tablet, Take 81 mg by mouth daily., Disp: , Rfl:  .  celecoxib (CELEBREX) 200 MG capsule, Take 200 mg by mouth daily. , Disp: , Rfl:  .  COMBIGAN 0.2-0.5 % ophthalmic solution, Place 1 drop into the right eye every 12 (twelve) hours. , Disp: , Rfl:  .  escitalopram (LEXAPRO) 20 MG tablet, TAKE 1 TABLET BY MOUTH EVERY DAY, Disp: , Rfl:  .  triamterene-hydrochlorothiazide (MAXZIDE-25) 37.5-25 MG tablet, Take 1 tablet by mouth every morning., Disp: , Rfl:  Allergies  Allergen Reactions  . Ibuprofen Other (See Comments) and Anaphylaxis    Patient stated she had lower edema in her legs and MD mentioned a possible allergic reactiona Patient stated she had lower edema in her legs and MD mentioned a possible allergic reactiona  . Fish Allergy Other (See Comments)    Per patient Possible allergy to trout. Lower edema in right leg.  . Other Other (See Comments)    Per patient Possible allergy to trout. Lower edema in right leg.   Social History   Occupational History  . Not on file  Tobacco Use  . Smoking status: Never Smoker  . Smokeless tobacco: Never Used  Substance and Sexual Activity  . Alcohol  use: No  . Drug use: No  . Sexual activity: Not on file    Objective:   Constitutional Kerry Garcia is a pleasant 86 y.o. African American female, WD, WN in NAD.Marland Kitchen AAO x 3.   Vascular Capillary refill time to digits immediate b/l. Palpable pedal pulses b/l LE. Pedal hair sparse. Lower extremity skin temperature gradient within normal limits. No pain with calf compression b/l. No edema noted b/l lower extremities.  No cyanosis or clubbing noted.  Neurologic Normal speech. Oriented to person, place, and time. Epicritic sensation to light touch grossly present bilaterally. Protective sensation intact 5/5 intact bilaterally with 10g monofilament b/l.  Dermatologic Pedal skin is thin shiny, atrophic b/l lower extremities. No open wounds bilaterally. No interdigital macerations bilaterally. Toenails 1-5 b/l elongated, discolored, dystrophic, thickened, crumbly with subungual debris and tenderness to dorsal palpation.  Orthopedic: Normal muscle strength 5/5 to all lower extremity muscle groups bilaterally. No pain crepitus or joint limitation noted with ROM b/l. No gross bony deformities bilaterally. Utilizes cane for ambulation assistance.   Radiographs: None Assessment:   1. Pain due to onychomycosis of toenails of both feet    Plan:  Patient was evaluated and treated and all questions answered.  Onychomycosis with pain -Nails palliatively debridement as below -Educated on self-care  Procedure: Nail Debridement Rationale: Pain Type of Debridement: manual, sharp debridement. Instrumentation: Nail nipper, rotary  burr. Number of Nails: 10 -Examined patient. -No new findings. No new orders. -Toenails 1-5 b/l were debrided in length and girth with sterile nail nippers and dremel without iatrogenic bleeding.  -Patient to report any pedal injuries to medical professional immediately. -Patient to continue soft, supportive shoe gear daily. -Patient/POA to call should there be  question/concern in the interim.  Return in about 3 months (around 02/06/2020) for nail trim.  Marzetta Board, DPM

## 2020-01-28 ENCOUNTER — Other Ambulatory Visit (HOSPITAL_BASED_OUTPATIENT_CLINIC_OR_DEPARTMENT_OTHER): Payer: Self-pay | Admitting: Internal Medicine

## 2020-01-28 ENCOUNTER — Ambulatory Visit: Payer: Self-pay | Attending: Internal Medicine

## 2020-01-28 DIAGNOSIS — Z23 Encounter for immunization: Secondary | ICD-10-CM

## 2020-01-28 NOTE — Progress Notes (Signed)
° °  Covid-19 Vaccination Clinic  Name:  Kerry Garcia    MRN: [id number] DOB: 07-Oct-1925  01/28/2020  Kerry Garcia was observed post Covid-19 immunization for 15 minutes without incident. She was provided with Vaccine Information Sheet and instruction to access the V-Safe system.   Kerry Garcia was instructed to call 911 with any severe reactions post vaccine:  Difficulty breathing   Swelling of face and throat   A fast heartbeat   A bad rash all over body   Dizziness and weakness   Immunizations Administered    Name Date Dose VIS Date [place] COVID-19 Vaccine 01/28/2020 10:03 AM 0.3 mL 01/02/2020 Intramuscular   Manufacturer: [place]   Lot: [id number]   NDC: [address]-6

## 2020-02-12 ENCOUNTER — Encounter: Payer: Self-pay | Admitting: Podiatry

## 2020-02-12 ENCOUNTER — Ambulatory Visit (INDEPENDENT_AMBULATORY_CARE_PROVIDER_SITE_OTHER): Payer: Medicare Other | Admitting: Podiatry

## 2020-02-12 ENCOUNTER — Other Ambulatory Visit: Payer: Self-pay

## 2020-02-12 DIAGNOSIS — B351 Tinea unguium: Secondary | ICD-10-CM

## 2020-02-12 DIAGNOSIS — I739 Peripheral vascular disease, unspecified: Secondary | ICD-10-CM

## 2020-02-12 DIAGNOSIS — M79674 Pain in right toe(s): Secondary | ICD-10-CM | POA: Diagnosis not present

## 2020-02-12 DIAGNOSIS — M79675 Pain in left toe(s): Secondary | ICD-10-CM | POA: Diagnosis not present

## 2020-02-12 NOTE — Progress Notes (Signed)
This patient presents to the office with chief complaint of long thick painful nails.  Patient says the nails are painful walking and wearing shoes.  This patient is unable to self treat.  This patient is unable to trim her nails since she is unable to reach her nails.  She presents to the office for preventative foot care services.  General Appearance  Alert, conversant and in no acute stress.  Vascular  Dorsalis pedis and posterior tibial  pulses are weakly palpable  bilaterally.  Capillary return is within normal limits  bilaterally. Temperature is within normal limits  bilaterally.  Neurologic  Senn-Weinstein monofilament wire test within normal limits  bilaterally. Muscle power within normal limits bilaterally.  Nails Thick disfigured discolored nails with subungual debris  from hallux to fifth toes bilaterally. No evidence of bacterial infection or drainage bilaterally.  Orthopedic  No limitations of motion  feet .  No crepitus or effusions noted.  No bony pathology or digital deformities noted.  HAV  B/L.  Skin  normotropic skin with no porokeratosis noted bilaterally.  No signs of infections or ulcers noted.     Onychomycosis  Nails  B/L.  Pain in right toes  Pain in left toes  Debridement of nails both feet with a nail nipper  Followed with trimming  the nails with dremel tool.    RTC 3 months.   Sharnee Douglass DPM  

## 2020-05-16 ENCOUNTER — Other Ambulatory Visit: Payer: Self-pay

## 2020-05-16 ENCOUNTER — Encounter: Payer: Self-pay | Admitting: Podiatry

## 2020-05-16 ENCOUNTER — Ambulatory Visit (INDEPENDENT_AMBULATORY_CARE_PROVIDER_SITE_OTHER): Payer: Medicare Other | Admitting: Podiatry

## 2020-05-16 DIAGNOSIS — I739 Peripheral vascular disease, unspecified: Secondary | ICD-10-CM | POA: Diagnosis not present

## 2020-05-16 DIAGNOSIS — M79674 Pain in right toe(s): Secondary | ICD-10-CM

## 2020-05-16 DIAGNOSIS — M79675 Pain in left toe(s): Secondary | ICD-10-CM

## 2020-05-16 DIAGNOSIS — B351 Tinea unguium: Secondary | ICD-10-CM

## 2020-05-16 NOTE — Progress Notes (Signed)
This patient presents to the office with chief complaint of long thick painful nails.  Patient says the nails are painful walking and wearing shoes.  This patient is unable to self treat.  This patient is unable to trim her nails since she is unable to reach her nails.  She presents to the office for preventative foot care services.  General Appearance  Alert, conversant and in no acute stress.  Vascular  Dorsalis pedis and posterior tibial  pulses are weakly palpable  bilaterally.  Capillary return is within normal limits  bilaterally. Temperature is within normal limits  bilaterally.  Neurologic  Senn-Weinstein monofilament wire test within normal limits  bilaterally. Muscle power within normal limits bilaterally.  Nails Thick disfigured discolored nails with subungual debris  from hallux to fifth toes bilaterally. No evidence of bacterial infection or drainage bilaterally.  Orthopedic  No limitations of motion  feet .  No crepitus or effusions noted.  No bony pathology or digital deformities noted.  HAV  B/L.  Skin  normotropic skin with no porokeratosis noted bilaterally.  No signs of infections or ulcers noted.     Onychomycosis  Nails  B/L.  Pain in right toes  Pain in left toes  Debridement of nails both feet with a nail nipper  Followed with trimming  the nails with dremel tool.    RTC 3 months.   Gregory Mayer DPM  

## 2020-06-14 ENCOUNTER — Emergency Department (HOSPITAL_COMMUNITY)
Admission: EM | Admit: 2020-06-14 | Discharge: 2020-06-14 | Disposition: A | Discharge status: Home / Self Care | Payer: Medicare Other | Attending: Emergency Medicine | Admitting: Emergency Medicine

## 2020-06-14 ENCOUNTER — Encounter (HOSPITAL_COMMUNITY): Payer: Self-pay

## 2020-06-14 ENCOUNTER — Other Ambulatory Visit: Payer: Self-pay

## 2020-06-14 ENCOUNTER — Emergency Department (HOSPITAL_COMMUNITY): Payer: Medicare Other

## 2020-06-14 DIAGNOSIS — R079 Chest pain, unspecified: Secondary | ICD-10-CM | POA: Diagnosis not present

## 2020-06-14 DIAGNOSIS — R5383 Other fatigue: Secondary | ICD-10-CM | POA: Insufficient documentation

## 2020-06-14 DIAGNOSIS — R531 Weakness: Secondary | ICD-10-CM | POA: Insufficient documentation

## 2020-06-14 DIAGNOSIS — F0390 Unspecified dementia without behavioral disturbance: Secondary | ICD-10-CM | POA: Insufficient documentation

## 2020-06-14 DIAGNOSIS — R0789 Other chest pain: Secondary | ICD-10-CM

## 2020-06-14 DIAGNOSIS — Z7982 Long term (current) use of aspirin: Secondary | ICD-10-CM | POA: Insufficient documentation

## 2020-06-14 DIAGNOSIS — R059 Cough, unspecified: Secondary | ICD-10-CM | POA: Diagnosis not present

## 2020-06-14 DIAGNOSIS — Z79899 Other long term (current) drug therapy: Secondary | ICD-10-CM | POA: Insufficient documentation

## 2020-06-14 DIAGNOSIS — R0602 Shortness of breath: Secondary | ICD-10-CM | POA: Insufficient documentation

## 2020-06-14 DIAGNOSIS — I10 Essential (primary) hypertension: Secondary | ICD-10-CM | POA: Insufficient documentation

## 2020-06-14 DIAGNOSIS — E039 Hypothyroidism, unspecified: Secondary | ICD-10-CM | POA: Diagnosis not present

## 2020-06-14 LAB — CBC WITH DIFFERENTIAL/PLATELET
Abs Immature Granulocytes: 0.01 10*3/uL (ref 0.00–0.07)
Basophils Absolute: 0 10*3/uL (ref 0.0–0.1)
Basophils Relative: 0 %
Eosinophils Absolute: 0.3 10*3/uL (ref 0.0–0.5)
Eosinophils Relative: 4 %
HCT: 33.8 % — ABNORMAL LOW (ref 36.0–46.0)
Hemoglobin: 10.7 g/dL — ABNORMAL LOW (ref 12.0–15.0)
Immature Granulocytes: 0 %
Lymphocytes Relative: 25 %
Lymphs Abs: 1.8 10*3/uL (ref 0.7–4.0)
MCH: 27.6 pg (ref 26.0–34.0)
MCHC: 31.7 g/dL (ref 30.0–36.0)
MCV: 87.3 fL (ref 80.0–100.0)
Monocytes Absolute: 0.7 10*3/uL (ref 0.1–1.0)
Monocytes Relative: 9 %
Neutro Abs: 4.4 10*3/uL (ref 1.7–7.7)
Neutrophils Relative %: 62 %
Platelets: 209 10*3/uL (ref 150–400)
RBC: 3.87 MIL/uL (ref 3.87–5.11)
RDW: 14.3 % (ref 11.5–15.5)
WBC: 7.1 10*3/uL (ref 4.0–10.5)
nRBC: 0 % (ref 0.0–0.2)

## 2020-06-14 LAB — COMPREHENSIVE METABOLIC PANEL
ALT: 11 U/L (ref 0–44)
AST: 21 U/L (ref 15–41)
Albumin: 3.2 g/dL — ABNORMAL LOW (ref 3.5–5.0)
Alkaline Phosphatase: 82 U/L (ref 38–126)
Anion gap: 7 (ref 5–15)
BUN: 18 mg/dL (ref 8–23)
CO2: 30 mmol/L (ref 22–32)
Calcium: 8.7 mg/dL — ABNORMAL LOW (ref 8.9–10.3)
Chloride: 105 mmol/L (ref 98–111)
Creatinine, Ser: 0.77 mg/dL (ref 0.44–1.00)
GFR, Estimated: >60 mL/min (ref >60)
Glucose, Bld: 77 mg/dL (ref 70–99)
Potassium: 4.3 mmol/L (ref 3.5–5.1)
Sodium: 142 mmol/L (ref 135–145)
Total Bilirubin: 0.6 mg/dL (ref 0.3–1.2)
Total Protein: 5.8 g/dL — ABNORMAL LOW (ref 6.5–8.1)

## 2020-06-14 LAB — URINALYSIS, ROUTINE W REFLEX MICROSCOPIC
Bilirubin Urine: NEGATIVE
Glucose, UA: NEGATIVE mg/dL
Hgb urine dipstick: NEGATIVE
Ketones, ur: NEGATIVE mg/dL
Leukocytes,Ua: NEGATIVE
Nitrite: NEGATIVE
Protein, ur: NEGATIVE mg/dL
Specific Gravity, Urine: 1.032 — ABNORMAL HIGH (ref 1.005–1.030)
pH: 7 (ref 5.0–8.0)

## 2020-06-14 LAB — TSH: TSH: 0.062 u[IU]/mL — ABNORMAL LOW (ref 0.350–4.500)

## 2020-06-14 LAB — D-DIMER, QUANTITATIVE: D-Dimer, Quant: 0.98 ug/mL-FEU — ABNORMAL HIGH (ref 0.00–0.50)

## 2020-06-14 LAB — TROPONIN I (HIGH SENSITIVITY)
Troponin I (High Sensitivity): 8 ng/L (ref <18)
Troponin I (High Sensitivity): 9 ng/L (ref <18)

## 2020-06-14 MED ORDER — SODIUM CHLORIDE 0.9 % IV SOLN: INTRAVENOUS | Status: DC

## 2020-06-14 MED ORDER — IOHEXOL 350 MG/ML SOLN
65.0000 mL | Freq: Once | INTRAVENOUS | Status: AC | PRN
  Administered 2020-06-14: 65 mL via INTRAVENOUS

## 2020-06-14 NOTE — Discharge Instructions (Signed)
As discussed, your evaluation today has been largely reassuring.  But, it is important that you monitor your condition carefully, and do not hesitate to return to the ED if you develop new, or concerning changes in your condition. ? ?Otherwise, please follow-up with your physician for appropriate ongoing care. ? ?

## 2020-06-14 NOTE — ED Notes (Signed)
Patient verbalizes understanding of discharge instructions. Opportunity for questioning and answers were provided. Armband removed by staff, pt discharged from ED via wheelchair.  

## 2020-06-14 NOTE — ED Triage Notes (Signed)
2 days of intermittent chest pain radiating to the left shoulder blade with feeling of palpations EMS did not see any in route

## 2020-06-14 NOTE — ED Provider Notes (Signed)
[name] EMERGENCY DEPARTMENT Provider Note   CSN: [id number] Arrival date & time: 06/14/20  1224     History Chief Complaint  Patient presents with  . Chest Pain    Kerry Garcia is a 87 y.o. female.  Patient is a 87 year old female with a history of dementia, hypertension, hypothyroidism, spinal stenosis who is presenting today reporting she just does not feel herself.  She has had a cough for the last 1 week but in the last few days reports she just feels lethargic.  She has had generalized weakness, fatigue and just feels like people would leave her alone.  She reports that the cough has produced a little bit of mucus but he she has not had any known fever.  She has been continuing to eat and drink her boost without difficulty.  She has no abdominal pain, nausea, vomiting or change in her bowel movements.  She does note that she had a brief episode of chest pain yesterday and today that she reports lasted about 5 seconds.  It was not related to exertion but reports when she had it today she felt really bad.  Her son has noted some difficulty breathing over the last few days but patient has still been able to get up and ambulate with her walker.  She denies any unilateral leg pain or swelling.  She has had no change in her medications and continues to take them regularly.  She reports that she did receive medications by EMS but she is not sure that they made any difference.  She denies any chest pain currently.  Urinary frequency but no dysuria.  The history is provided by the patient.  Chest Pain Associated symptoms: cough, fatigue and shortness of breath   Associated symptoms: no abdominal pain, no fever, no nausea, no vomiting and no weakness   Risk factors: no coronary artery disease   Risk factors comment:  Dementia, htn      Past Medical History:  Diagnosis Date  . Carpal tunnel syndrome   . Dementia ([place])   . Depression   . Fibromyalgia   .  Hypertension   . Hypothyroidism   . Incontinence   . Spinal stenosis     Patient Active Problem List   Diagnosis Date Noted  . Pain due to onychomycosis of toenails of both feet 08/25/2018  . Primary osteoarthritis of left hand 08/27/2016  . Primary osteoarthritis of both first carpometacarpal joints 03/26/2016  . Carpal tunnel syndrome on left 01/02/2016  . Left hand pain 12/10/2015  . Hypertension   . Incontinence   . Multinodular goiter   . Depression   . CLAUDICATION 12/02/2009  . CHEST PAIN 12/02/2009    Past Surgical History:  Procedure Laterality Date  . BREAST LUMPECTOMY  1965  . SPINE SURGERY       OB History    Gravida  6   Para  5   Term      Preterm      AB  1   Living  4     SAB      IAB      Ectopic      Multiple      Live Births              Family History  Problem Relation Age of Onset  . Heart failure Father   . Prostate cancer Son     Social History   Tobacco Use  . Smoking  status: Never Smoker  . Smokeless tobacco: Never Used  Substance Use Topics  . Alcohol use: No  . Drug use: No    Home Medications Prior to Admission medications   Medication Sig Start Date End Date Taking? Authorizing Provider  aspirin EC 81 MG tablet Take 81 mg by mouth daily.   Yes [provider]  celecoxib (CELEBREX) 200 MG capsule Take 200 mg by mouth daily.  08/14/18  Yes [provider]  COMBIGAN 0.2-0.5 % ophthalmic solution Place 1 drop into the right eye every 12 (twelve) hours.  06/22/18  Yes [provider]  escitalopram (LEXAPRO) 20 MG tablet TAKE 1 TABLET BY MOUTH EVERY DAY 05/31/16  Yes [provider]  triamterene-hydrochlorothiazide (MAXZIDE-25) 37.5-25 MG tablet Take 1 tablet by mouth every morning. 06/25/18  Yes [provider]    Allergies    Ibuprofen, Ciprofloxacin, Fish allergy, Lisinopril, and Other  Review of Systems   Review of Systems  Constitutional: Positive for fatigue.  Negative for fever.  Respiratory: Positive for cough and shortness of breath.   Cardiovascular: Positive for chest pain.  Gastrointestinal: Negative for abdominal pain, nausea and vomiting.  Neurological: Negative for weakness.  All other systems reviewed and are negative.   Physical Exam Updated Vital Signs BP (!) 146/58   Pulse 63   Temp 98.6 F (37 C) (Oral)   Resp 17   Wt 80 kg   LMP 03/16/1963   SpO2 99%   BMI 30.27 kg/m   Physical Exam Vitals and nursing note reviewed.  Constitutional:      General: She is not in acute distress.    Appearance: Normal appearance. She is well-developed.  HENT:     Head: Normocephalic and atraumatic.     Mouth/Throat:     Mouth: Mucous membranes are moist.  Eyes:     Pupils: Pupils are equal, round, and reactive to light.  Cardiovascular:     Rate and Rhythm: Normal rate and regular rhythm.     Pulses: Normal pulses.     Heart sounds: Normal heart sounds. No murmur heard.  No systolic murmur is present.  No diastolic murmur is present. No friction rub.  Pulmonary:     Effort: Pulmonary effort is normal.     Breath sounds: Normal breath sounds. No wheezing or rales.  Abdominal:     General: Bowel sounds are normal. There is no distension.     Palpations: Abdomen is soft.     Tenderness: There is no abdominal tenderness. There is no guarding or rebound.  Musculoskeletal:        General: No tenderness. Normal range of motion.     Cervical back: Normal range of motion.     Right lower leg: No edema.     Left lower leg: No edema.     Comments: No edema  Skin:    General: Skin is warm and dry.     Findings: No rash.  Neurological:     Mental Status: She is alert and oriented to person, place, and time. Mental status is at baseline.     Cranial Nerves: No cranial nerve deficit.  Psychiatric:        Mood and Affect: Mood normal.        Behavior: Behavior normal.        Thought Content: Thought content normal.      ED  Results / Procedures / Treatments   Labs (all labs ordered are listed, but only abnormal results  are displayed) Labs Reviewed  CBC WITH DIFFERENTIAL/PLATELET - Abnormal; Notable for the following components:      Result Value   Hemoglobin 10.7 (*)    HCT 33.8 (*)    All other components within normal limits  COMPREHENSIVE METABOLIC PANEL - Abnormal; Notable for the following components:   Calcium 8.7 (*)    Total Protein 5.8 (*)    Albumin 3.2 (*)    All other components within normal limits  URINALYSIS, ROUTINE W REFLEX MICROSCOPIC  D-DIMER, QUANTITATIVE  TSH  TROPONIN I (HIGH SENSITIVITY)  TROPONIN I (HIGH SENSITIVITY)    EKG EKG Interpretation  Date/Time:  Saturday June 14 2020 12:32:01 EDT Ventricular Rate:  65 PR Interval:  154 QRS Duration: 90 QT Interval:  441 QTC Calculation: 459 R Axis:   79 Text Interpretation: Sinus rhythm Atrial premature complex Nonspecific T abnormalities, lateral leads No significant change since last tracing Confirmed by Blanchie Dessert ([id number]) on 06/14/2020 1:06:57 PM   Radiology DG Chest Port 1 View  Result Date: 06/14/2020 CLINICAL DATA:  Shortness of breath and weakness. EXAM: PORTABLE CHEST 1 VIEW COMPARISON:  05/20/2019, 05/24/2010 and chest CT 05/24/2010 FINDINGS: Lungs are adequately inflated without focal lobar consolidation or effusion. Cardiac silhouette is unremarkable. Calcified left hilar lymph nodes unchanged. Moderate density in the right upper paratracheal region with mild deviation of the trachea to the left unchanged compatible with known enlarged thyroid/substernal goiter. Remainder of the exam is unchanged. IMPRESSION: 1. No acute cardiopulmonary disease. 2. Evidence of previous granulomatous disease. 3. Evidence of known enlarged thyroid/substernal goiter. Electronically Signed   By: Marin Olp M.D.   On: 06/14/2020 13:59    Procedures Procedures   Medications Ordered in ED Medications  0.9 %  sodium chloride  infusion ( Intravenous New Bag/Given 06/14/20 1417)    ED Course  I have reviewed the triage vital signs and the nursing notes.  Pertinent labs & imaging results that were available during my care of the patient were reviewed by me and considered in my medical decision making (see chart for details).    MDM Rules/Calculators/A&P                          Elderly female presenting today with vague symptoms of not feeling herself, she is having some shortness of breath, nonspecific cough and nonspecific chest pain 2 episodes since yesterday.  Patient is well-appearing here with reassuring vital signs.  She is satting 99% on room air and has no abnormal breath sounds.  Low suspicion at this time for ACS.  Patient is low risk [place] and has no unilateral leg pain or swelling.  No prior history of DVT or PE.  She denies any abdominal pain or change in appetite.  She does not know of any fever.  Son reports that he is noted she does been winded.  She has no evidence of fluid overload on exam such as distal edema or JVD.  Chest x-ray without acute findings, CBC, CMP and troponin are all within normal limits.  We will check a D-dimer, TSH and delta troponin to ensure no acute findings.  Chest x-ray does show enlarged thyroid goiter but she is having no difficulty swallowing.   EKG is unchanged from prior.  MDM Number of Diagnoses or Management Options   Amount and/or Complexity of Data Reviewed Clinical lab tests: ordered and reviewed Tests in the radiology section of CPT: reviewed and ordered Tests in the medicine  section of CPT: ordered and reviewed Decide to obtain previous medical records or to obtain history from someone other than the patient: yes Review and summarize past medical records: yes Independent visualization of images, tracings, or specimens: yes   Final Clinical Impression(s) / ED Diagnoses Final diagnoses:  None    Rx / DC Orders ED Discharge Orders    None        Blanchie Dessert, MD 06/14/20 1558

## 2020-06-14 NOTE — ED Provider Notes (Signed)
7:16 PM Patient in no distress, no hypoxia, no increased work of breathing.  I discussed results of CT angiography and second troponin with patient and her female companion.  No evidence for pulmonary embolism, no evidence for ACS.  Patient has preference for discharge, this was accommodated.   Carmin Muskrat, MD 06/14/20 [phone]

## 2020-06-14 NOTE — Medical Student Note (Incomplete)
[facility] DEPT Provider Student Note For educational purposes for Medical, PA and NP students only and not part of the legal medical record.   CSN: [id number] Arrival date & time: 06/14/20  1224      History   Chief Complaint Chief Complaint  Patient presents with  . Chest Pain    HPI Natalie BROOK GERACI is a 87 y.o. female.   Chest Pain Pain location:  Substernal area Pain quality: sharp   Pain radiates to:  Does not radiate Pain severity:  Mild Onset quality:  Sudden Duration: 5 seconds. Timing: Yesterday and this am. Chronicity:  New Context: not stress   Relieved by: Pressing pillow to chest. Ineffective treatments:  None tried Associated symptoms: cough and fatigue   Associated symptoms: no diaphoresis and no fever   Risk factors: hypertension   Risk factors: no coronary artery disease, no diabetes mellitus, no high cholesterol, not obese and no smoking     Past Medical History:  Diagnosis Date  . Carpal tunnel syndrome   . Dementia ([place])   . Depression   . Fibromyalgia   . Hypertension   . Hypothyroidism   . Incontinence   . Spinal stenosis     Patient Active Problem List   Diagnosis Date Noted  . Pain due to onychomycosis of toenails of both feet 08/25/2018  . Primary osteoarthritis of left hand 08/27/2016  . Primary osteoarthritis of both first carpometacarpal joints 03/26/2016  . Carpal tunnel syndrome on left 01/02/2016  . Left hand pain 12/10/2015  . Hypertension   . Incontinence   . Multinodular goiter   . Depression   . CLAUDICATION 12/02/2009  . CHEST PAIN 12/02/2009    Past Surgical History:  Procedure Laterality Date  . BREAST LUMPECTOMY  1965  . SPINE SURGERY      OB History    Gravida  6   Para  5   Term      Preterm      AB  1   Living  4     SAB      IAB      Ectopic      Multiple      Live Births               Home Medications    Prior to Admission medications   Medication Sig Start  Date End Date Taking? Authorizing Provider  aspirin EC 81 MG tablet Take 81 mg by mouth daily.    [provider]  celecoxib (CELEBREX) 200 MG capsule Take 200 mg by mouth daily.  08/14/18   [provider]  COMBIGAN 0.2-0.5 % ophthalmic solution Place 1 drop into the right eye every 12 (twelve) hours.  06/22/18   [provider]  escitalopram (LEXAPRO) 20 MG tablet TAKE 1 TABLET BY MOUTH EVERY DAY 05/31/16   [provider]  triamterene-hydrochlorothiazide (MAXZIDE-25) 37.5-25 MG tablet Take 1 tablet by mouth every morning. 06/25/18   [provider]    Family History Family History  Problem Relation Age of Onset  . Heart failure Father   . Prostate cancer Son     Social History Social History   Tobacco Use  . Smoking status: Never Smoker  . Smokeless tobacco: Never Used  Substance Use Topics  . Alcohol use: No  . Drug use: No     Allergies   Ibuprofen, Ciprofloxacin, Fish allergy, Lisinopril, and Other   Review of Systems Review of Systems  Constitutional: Positive  for activity change and fatigue. Negative for appetite change, chills, diaphoresis and fever.  Respiratory: Positive for cough.        With mucous production  Cardiovascular: Positive for chest pain. Negative for leg swelling.  Endocrine: Positive for polyuria.     Physical Exam Updated Vital Signs BP (!) 144/58   Pulse 64   Temp 98.6 F (37 C) (Oral)   Resp 17   Wt 80 kg   LMP 03/16/1963   SpO2 100%   BMI 30.27 kg/m   Physical Exam Constitutional:      General: She is not in acute distress.    Appearance: She is not ill-appearing, toxic-appearing or diaphoretic.  Cardiovascular:     Rate and Rhythm: Normal rate and regular rhythm.     Heart sounds: Normal heart sounds.  Pulmonary:     Effort: Pulmonary effort is normal. No tachypnea, accessory muscle usage or respiratory distress.     Breath sounds: Normal breath sounds. No stridor. No decreased breath  sounds, wheezing, rhonchi or rales.  Chest:     Chest wall: No tenderness or crepitus.  Skin:    General: Skin is warm and dry.  Neurological:     Mental Status: She is alert and oriented to person, place, and time.  Psychiatric:        Mood and Affect: Mood normal.        Behavior: Behavior normal.      ED Treatments / Results  Labs (all labs ordered are listed, but only abnormal results are displayed) Labs Reviewed  CBC WITH DIFFERENTIAL/PLATELET  COMPREHENSIVE METABOLIC PANEL  URINALYSIS, ROUTINE W REFLEX MICROSCOPIC  TROPONIN I (HIGH SENSITIVITY)    EKG  Radiology No results found.  Procedures Procedures (including critical care time)  Medications Ordered in ED Medications  0.9 %  sodium chloride infusion (has no administration in time range)     Initial Impression / Assessment and Plan / ED Course  I have reviewed the triage vital signs and the nursing notes.  Pertinent labs & imaging results that were available during my care of the patient were reviewed by me and considered in my medical decision making (see chart for details).     Pt has had two episodes of <5sec mildly sharp chest pain. Also notes not feeling like herself for the last week and endorses cough for last week and urinating more last night. Afebrile. Lungs CTA. EKG unchanged from previous. Ordered CBC, CMP, Troponin, UA, and CXR.  Final Clinical Impressions(s) / ED Diagnoses   Final diagnoses:  None    New Prescriptions New Prescriptions   No medications on file

## 2020-06-30 ENCOUNTER — Encounter: Payer: Self-pay | Admitting: Neurology

## 2020-08-19 ENCOUNTER — Ambulatory Visit: Payer: Medicare Other | Admitting: Podiatry

## 2020-09-04 ENCOUNTER — Emergency Department (HOSPITAL_COMMUNITY): Payer: Medicare Other

## 2020-09-04 ENCOUNTER — Encounter (HOSPITAL_COMMUNITY): Payer: Self-pay

## 2020-09-04 ENCOUNTER — Emergency Department (HOSPITAL_COMMUNITY)
Admission: EM | Admit: 2020-09-04 | Discharge: 2020-09-04 | Disposition: A | Discharge status: Home / Self Care | Payer: Medicare Other | Attending: Emergency Medicine | Admitting: Emergency Medicine

## 2020-09-04 ENCOUNTER — Other Ambulatory Visit: Payer: Self-pay

## 2020-09-04 DIAGNOSIS — R002 Palpitations: Secondary | ICD-10-CM | POA: Diagnosis not present

## 2020-09-04 DIAGNOSIS — R072 Precordial pain: Secondary | ICD-10-CM | POA: Diagnosis present

## 2020-09-04 DIAGNOSIS — E039 Hypothyroidism, unspecified: Secondary | ICD-10-CM | POA: Insufficient documentation

## 2020-09-04 DIAGNOSIS — Z20822 Contact with and (suspected) exposure to covid-19: Secondary | ICD-10-CM | POA: Diagnosis not present

## 2020-09-04 DIAGNOSIS — Z79899 Other long term (current) drug therapy: Secondary | ICD-10-CM | POA: Diagnosis not present

## 2020-09-04 DIAGNOSIS — I10 Essential (primary) hypertension: Secondary | ICD-10-CM | POA: Diagnosis not present

## 2020-09-04 DIAGNOSIS — R601 Generalized edema: Secondary | ICD-10-CM | POA: Insufficient documentation

## 2020-09-04 DIAGNOSIS — F0390 Unspecified dementia without behavioral disturbance: Secondary | ICD-10-CM | POA: Diagnosis not present

## 2020-09-04 DIAGNOSIS — Z7982 Long term (current) use of aspirin: Secondary | ICD-10-CM | POA: Diagnosis not present

## 2020-09-04 LAB — RESP PANEL BY RT-PCR (FLU A&B, COVID) ARPGX2
Influenza A by PCR: NEGATIVE
Influenza B by PCR: NEGATIVE
SARS Coronavirus 2 by RT PCR: NEGATIVE

## 2020-09-04 LAB — CBC WITH DIFFERENTIAL/PLATELET
Abs Immature Granulocytes: 0.03 10*3/uL (ref 0.00–0.07)
Basophils Absolute: 0 10*3/uL (ref 0.0–0.1)
Basophils Relative: 0 %
Eosinophils Absolute: 0.4 10*3/uL (ref 0.0–0.5)
Eosinophils Relative: 5 %
HCT: 38.5 % (ref 36.0–46.0)
Hemoglobin: 11.6 g/dL — ABNORMAL LOW (ref 12.0–15.0)
Immature Granulocytes: 0 %
Lymphocytes Relative: 25 %
Lymphs Abs: 2 10*3/uL (ref 0.7–4.0)
MCH: 26 pg (ref 26.0–34.0)
MCHC: 30.1 g/dL (ref 30.0–36.0)
MCV: 86.3 fL (ref 80.0–100.0)
Monocytes Absolute: 0.8 10*3/uL (ref 0.1–1.0)
Monocytes Relative: 10 %
Neutro Abs: 4.8 10*3/uL (ref 1.7–7.7)
Neutrophils Relative %: 60 %
Platelets: 193 10*3/uL (ref 150–400)
RBC: 4.46 MIL/uL (ref 3.87–5.11)
RDW: 15.1 % (ref 11.5–15.5)
WBC: 8 10*3/uL (ref 4.0–10.5)
nRBC: 0 % (ref 0.0–0.2)

## 2020-09-04 LAB — COMPREHENSIVE METABOLIC PANEL
ALT: 12 U/L (ref 0–44)
AST: 24 U/L (ref 15–41)
Albumin: 3.4 g/dL — ABNORMAL LOW (ref 3.5–5.0)
Alkaline Phosphatase: 94 U/L (ref 38–126)
Anion gap: 8 (ref 5–15)
BUN: 25 mg/dL — ABNORMAL HIGH (ref 8–23)
CO2: 27 mmol/L (ref 22–32)
Calcium: 8.7 mg/dL — ABNORMAL LOW (ref 8.9–10.3)
Chloride: 104 mmol/L (ref 98–111)
Creatinine, Ser: 0.89 mg/dL (ref 0.44–1.00)
GFR, Estimated: >60 mL/min (ref >60)
Glucose, Bld: 99 mg/dL (ref 70–99)
Potassium: 3.9 mmol/L (ref 3.5–5.1)
Sodium: 139 mmol/L (ref 135–145)
Total Bilirubin: 0.4 mg/dL (ref 0.3–1.2)
Total Protein: 5.9 g/dL — ABNORMAL LOW (ref 6.5–8.1)

## 2020-09-04 LAB — URINALYSIS, ROUTINE W REFLEX MICROSCOPIC
Bilirubin Urine: NEGATIVE
Glucose, UA: NEGATIVE mg/dL
Hgb urine dipstick: NEGATIVE
Ketones, ur: NEGATIVE mg/dL
Leukocytes,Ua: NEGATIVE
Nitrite: NEGATIVE
Protein, ur: NEGATIVE mg/dL
Specific Gravity, Urine: 1.011 (ref 1.005–1.030)
pH: 8 (ref 5.0–8.0)

## 2020-09-04 LAB — TROPONIN I (HIGH SENSITIVITY)
Troponin I (High Sensitivity): 15 ng/L (ref <18)
Troponin I (High Sensitivity): 18 ng/L — ABNORMAL HIGH (ref <18)

## 2020-09-04 LAB — BRAIN NATRIURETIC PEPTIDE: B Natriuretic Peptide: 154.4 pg/mL — ABNORMAL HIGH (ref 0.0–100.0)

## 2020-09-04 NOTE — Discharge Instructions (Addendum)
Work-up here today for the chest pain without any acute findings.  Make an appointment to follow-up with Dr. Delfina Redwood your primary care doctor as well as cardiology.  Information provided above.  Also the social worker is making arrangements for a home health nurse and physical therapy at home and social worker in a [facility] [place].

## 2020-09-04 NOTE — ED Provider Notes (Signed)
[facility] EMERGENCY DEPARTMENT Provider Note   CSN: [id number] Arrival date & time: 09/04/20  1637     History Chief Complaint  Patient presents with   Altered Mental Status   Palpitations    Kerry Garcia is a 87 y.o. female.  Patient brought in by EMS.  Patient brought in from an old apartment building not assisted living.  Patient has advanced dementia.  Original complaint was chest pain then palpitations.  She has been evaluated recently for similar complaint.  Patient was seen April 2 with the diagnosis of atypical chest pain and then March 7 with the same diagnosis.  Patient's primary care doctor is Dr. Delfina Redwood.  EMS gave aspirin ED EKG by EMS was unremarkable.  Patient upon arrival here in no acute distress.  Patient does have significant dementia as mentioned.  Complained of chest pain but seems comfortable.  Past medical history significant for fibromyalgia hypertension hypothyroidism.      Past Medical History:  Diagnosis Date   Carpal tunnel syndrome    Dementia ([place])    Depression    Fibromyalgia    Hypertension    Hypothyroidism    Incontinence    Spinal stenosis     Patient Active Problem List   Diagnosis Date Noted   Pain due to onychomycosis of toenails of both feet 08/25/2018   Primary osteoarthritis of left hand 08/27/2016   Primary osteoarthritis of both first carpometacarpal joints 03/26/2016   Carpal tunnel syndrome on left 01/02/2016   Left hand pain 12/10/2015   Hypertension    Incontinence    Multinodular goiter    Depression    CLAUDICATION 12/02/2009   CHEST PAIN 12/02/2009    Past Surgical History:  Procedure Laterality Date   BREAST LUMPECTOMY  1965   SPINE SURGERY       OB History     Gravida  6   Para  5   Term      Preterm      AB  1   Living  4      SAB      IAB      Ectopic      Multiple      Live Births              Family History  Problem Relation Age of Onset   Heart  failure Father    Prostate cancer Son     Social History   Tobacco Use   Smoking status: Never   Smokeless tobacco: Never  Substance Use Topics   Alcohol use: No   Drug use: No    Home Medications Prior to Admission medications   Medication Sig Start Date End Date Taking? Authorizing Provider  celecoxib (CELEBREX) 200 MG capsule Take 200 mg by mouth daily at 2 PM. 08/14/18  Yes [provider]  COMBIGAN 0.2-0.5 % ophthalmic solution Place 1 drop into the right eye every 12 (twelve) hours.  06/22/18  Yes [provider]  escitalopram (LEXAPRO) 20 MG tablet Take 20 mg by mouth at bedtime. 05/31/16  Yes [provider]  triamterene-hydrochlorothiazide (MAXZIDE-25) 37.5-25 MG tablet Take 1 tablet by mouth every morning. 06/25/18  Yes [provider]  aspirin EC 81 MG tablet Take 81 mg by mouth daily. Patient not taking: No sig reported    [provider]    Allergies    Ibuprofen, Ciprofloxacin, Fish allergy, Lisinopril, Other, and Sulfa antibiotics  Review of Systems  Review of Systems  Constitutional:  Negative for chills and fever.  HENT:  Negative for ear pain and sore throat.   Eyes:  Negative for pain and visual disturbance.  Respiratory:  Negative for cough and shortness of breath.   Cardiovascular:  Positive for chest pain. Negative for palpitations.  Gastrointestinal:  Negative for abdominal pain and vomiting.  Genitourinary:  Negative for dysuria and hematuria.  Musculoskeletal:  Negative for arthralgias and back pain.  Skin:  Negative for color change and rash.  Neurological:  Negative for seizures and syncope.  All other systems reviewed and are negative.  Physical Exam Updated Vital Signs BP (!) 133/115   Pulse 70   Temp 98.1 F (36.7 C) (Oral)   Resp 19   Ht 1.626 m (5\' 4" )   Wt 80 kg   LMP 03/16/1963   SpO2 96%   BMI 30.27 kg/m   Physical Exam Vitals and nursing note reviewed.  Constitutional:      General:  She is not in acute distress.    Appearance: Normal appearance. She is well-developed.  HENT:     Head: Normocephalic and atraumatic.     Mouth/Throat:     Mouth: Mucous membranes are moist.  Eyes:     Extraocular Movements: Extraocular movements intact.     Conjunctiva/sclera: Conjunctivae normal.     Pupils: Pupils are equal, round, and reactive to light.  Cardiovascular:     Rate and Rhythm: Normal rate and regular rhythm.     Heart sounds: No murmur heard. Pulmonary:     Effort: Pulmonary effort is normal. No respiratory distress.     Breath sounds: Normal breath sounds.  Chest:     Chest wall: No tenderness.  Abdominal:     Palpations: Abdomen is soft.     Tenderness: There is no abdominal tenderness.  Musculoskeletal:        General: Swelling present. Normal range of motion.     Cervical back: Neck supple.     Right lower leg: Edema present.     Left lower leg: Edema present.     Comments: Bilateral lower extremity pitting edema.  1+.  Skin:    General: Skin is warm and dry.     Capillary Refill: Capillary refill takes less than 2 seconds.  Neurological:     General: No focal deficit present.     Mental Status: She is alert. Mental status is at baseline.    ED Results / Procedures / Treatments   Labs (all labs ordered are listed, but only abnormal results are displayed) Labs Reviewed  CBC WITH DIFFERENTIAL/PLATELET - Abnormal; Notable for the following components:      Result Value   Hemoglobin 11.6 (*)    All other components within normal limits  COMPREHENSIVE METABOLIC PANEL - Abnormal; Notable for the following components:   BUN 25 (*)    Calcium 8.7 (*)    Total Protein 5.9 (*)    Albumin 3.4 (*)    All other components within normal limits  BRAIN NATRIURETIC PEPTIDE - Abnormal; Notable for the following components:   B Natriuretic Peptide 154.4 (*)    All other components within normal limits  URINALYSIS, ROUTINE W REFLEX MICROSCOPIC - Abnormal; Notable  for the following components:   Color, Urine STRAW (*)    All other components within normal limits  TROPONIN I (HIGH SENSITIVITY) - Abnormal; Notable for the following components:   Troponin I (High Sensitivity) 18 (*)    All  other components within normal limits  RESP PANEL BY RT-PCR (FLU A&B, COVID) [id number]  TROPONIN I (HIGH SENSITIVITY)    EKG EKG Interpretation  Date/Time:  Thursday September 04 2020 16:40:29 EDT Ventricular Rate:  78 PR Interval:  146 QRS Duration: 82 QT Interval:  429 QTC Calculation: 489 R Axis:   79 Text Interpretation: Sinus rhythm Consider left ventricular hypertrophy Nonspecific T abnrm, anterolateral leads Borderline prolonged QT interval No significant change since last tracing Confirmed by Fredia Sorrow [phone]) on 09/04/2020 6:06:37 PM  Radiology CT Head Wo Contrast  Result Date: 09/04/2020 CLINICAL DATA:  Mental status change, unknown cause 87 year old dementia patient. EXAM: CT HEAD WITHOUT CONTRAST TECHNIQUE: Contiguous axial images were obtained from the base of the skull through the vertex without intravenous contrast. COMPARISON:  Head CT 01/18/2016 FINDINGS: Brain: Stable normal for age atrophy. Moderate to advanced chronic small vessel ischemia. No intracranial hemorrhage, mass effect, or midline shift. No hydrocephalus. The basilar cisterns are patent. No evidence of territorial infarct or acute ischemia. No extra-axial or intracranial fluid collection. Vascular: Atherosclerosis of skullbase vasculature without hyperdense vessel or abnormal calcification. Skull: Prior suboccipital decompression of the foramen magnum. No skull fracture or acute findings. Sinuses/Orbits: No acute findings. Mild chronic mucosal thickening of the paranasal sinuses. Suspected prior right mastoidectomy. Bilateral cataract resection. Other: None. IMPRESSION: 1. No acute intracranial abnormality. 2. Stable atrophy from 2017. Mild progression in chronic small vessel ischemia.  Electronically Signed   By: Keith Rake M.D.   On: 09/04/2020 21:18   DG Chest Port 1 View  Result Date: 09/04/2020 CLINICAL DATA:  87 year old female with chest pain and leg swelling. EXAM: PORTABLE CHEST 1 VIEW COMPARISON:  Chest radiograph dated 06/14/2020. FINDINGS: Bibasilar atelectasis. No focal consolidation, pleural effusion, pneumothorax. Stable cardiomegaly. Atherosclerotic calcification of the aorta. Left hilar calcified granuloma. Osteopenia with degenerative changes of the spine. No acute osseous pathology. IMPRESSION: No active disease. Electronically Signed   By: Anner Crete M.D.   On: 09/04/2020 19:23    Procedures Procedures   Medications Ordered in ED Medications - No data to display  ED Course  I have reviewed the triage vital signs and the nursing notes.  Pertinent labs & imaging results that were available during my care of the patient were reviewed by me and considered in my medical decision making (see chart for details).    MDM Rules/Calculators/A&P                          Patient here in no acute distress.  Work-up troponins x2 without any significant change.  Urine is negative for urinary tract infection.  BNP elevated some at 154.  But chest x-ray without any evidence of pulmonary edema.  Patient does have lower extremity edema.  So there could be a component of some congestive heart failure.  And it may very well be right-sided heart failure.  We will have patient follow-up with cardiology.  But no indication for admission.  COVID testing was negative.  CT head without any acute findings.  Social worker involved to provide some home health care with RN PT health assistant and social worker face-to-face paperwork completed.  Patient stable for discharge home follow-up with cardiology and primary care doctor.  And home health needs have been arranged.   Final Clinical Impression(s) / ED Diagnoses Final diagnoses:  Dementia without behavioral  disturbance, unspecified dementia type ([place])  Precordial pain    Rx / DC Orders ED  Discharge Orders     None        Fredia Sorrow, MD 09/04/20 [phone]

## 2020-09-04 NOTE — Care Management (Signed)
ED RNCM  met with patient and son Kerry Garcia [phone] at bedside.  Patient  reports living at home with another son who assist with limited  care.  Patient states she ambulates with walker, but has been having some difficulty lately with ambulation,and ankle swelling feelings of . Patient also states when asked if she has been taking medications, she doesn't remember to take medications regularly.  Discussed [facility] services, and that patient may benefit from the services patient and son are agreeable. RNCM discussed the preferred [facility] agencies and explained that [facility] will  send referral to several of our preferred [facility] agencies to determine who will be able to accept the referral.  Also discussed in home care services and the process informed patient and family that these services are self-pay patient and son verbalized understanding. ED CM will follow up with patient and family by Monday 6/27 to ensure [facility] services has been arranged.

## 2020-09-04 NOTE — ED Notes (Signed)
Urine culture sent down with urine specimen  

## 2020-09-04 NOTE — ED Notes (Signed)
Patient verbalizes understanding of discharge instructions. Opportunity for questioning and answers were provided. Armband removed by staff, pt discharged from ED via wheelchair.  

## 2020-09-04 NOTE — ED Notes (Addendum)
Family updated as to patient's status, social worker talking with patient's POA.  Wade,Fred (Son)  [phone] [facility]

## 2020-09-04 NOTE — ED Notes (Signed)
Pt transported to CT ?

## 2020-09-04 NOTE — ED Triage Notes (Signed)
BIB from a 72 and older apartment building, not an assisted living. Advanced dementia, original  complaint of chest pain, then palpitations, then asked what she was doing her. EMS gave 324 of aspirin and an unremarkable EKG.

## 2020-09-05 ENCOUNTER — Encounter (HOSPITAL_COMMUNITY): Payer: Self-pay

## 2020-09-08 NOTE — Progress Notes (Signed)
[facility]  Cardiology Office Note:    Date:  09/10/2020   ID:  Kerry Garcia, DOB 29-Jun-1925, MRN [id number]  PCP:  [name], MD   [facility] Providers Cardiologist:  None {  Referring MD: [name], MD    History of Present Illness:    Kerry Garcia is a 87 y.o. female with a hx of advanced dementia, depression, HTN, and fibromyalgia with multiple ER visits for chest pain who was referred by Dr. Delfina Redwood for further evaluation of chest pain.  Patient was seen on 09/04/20 for chest pain, which was difficult to describe further due to dementia. Trops reassuringly normal. BNP mildly elevated at 154 but no pulmonary edema on CXR. She was discharged home with plans to follow-up with cardiology for further management.   History difficult to obtain due to baseline dementia. Patient states she has been having intermittent chest pain and LE edema for the past couple of months. No significant shortness of breath at rest but minor dyspnea on exertion. Occasional orthopnea and sleeps propped up on multiple pillows. No known history of coronary artery disease or heart failure. No lightheadedness, dizziness, PND, palpitations, fevers or chills.   Past Medical History:  Diagnosis Date   Carpal tunnel syndrome    Dementia ([place])    Depression    Fibromyalgia    Hypertension    Hypothyroidism    Incontinence    Spinal stenosis     Past Surgical History:  Procedure Laterality Date   BREAST LUMPECTOMY  1965   SPINE SURGERY      Current Medications: Current Meds  Medication Sig   amLODipine (NORVASC) 5 MG tablet Take 1 tablet (5 mg total) by mouth daily.   celecoxib (CELEBREX) 200 MG capsule Take 200 mg by mouth daily at 2 PM.   COMBIGAN 0.2-0.5 % ophthalmic solution Place 1 drop into the right eye every 12 (twelve) hours.    escitalopram (LEXAPRO) 20 MG tablet Take 20 mg by mouth at bedtime.   furosemide (LASIX) 20 MG  tablet Take 1 tablet (20 mg total) by mouth daily.   [DISCONTINUED] triamterene-hydrochlorothiazide (MAXZIDE-25) 37.5-25 MG tablet Take 1 tablet by mouth every morning.     Allergies:   Ibuprofen, Ciprofloxacin, Fish allergy, Lisinopril, Other, and Sulfa antibiotics   Social History   Socioeconomic History   Marital status: Widowed    Spouse name: Not on file   Number of children: Not on file   Years of education: Not on file   Highest education level: Not on file  Occupational History   Not on file  Tobacco Use   Smoking status: Never   Smokeless tobacco: Never  Substance and Sexual Activity   Alcohol use: No   Drug use: No   Sexual activity: Not on file  Other Topics Concern   Not on file  Social History Narrative   ** Merged History Encounter **       Social Determinants of Health   Financial Resource Strain: Not on file  Food Insecurity: Not on file  Transportation Needs: Not on file  Physical Activity: Not on file  Stress: Not on file  Social Connections: Not on file     Family History: The patient's family history includes Heart failure in her father; Prostate cancer in her son.  ROS:   Please see the history of present illness.    Review of Systems  Constitutional:  Negative for chills and fever.  HENT:  Negative for hearing loss.   Eyes:  Negative for blurred vision.  Respiratory:  Positive for shortness of breath.   Cardiovascular:  Positive for chest pain, orthopnea and leg swelling. Negative for palpitations, claudication and PND.  Gastrointestinal:  Negative for nausea and vomiting.  Genitourinary:  Negative for flank pain.  Musculoskeletal:  Positive for joint pain and myalgias.  Neurological:  Negative for dizziness and loss of consciousness.  Endo/Heme/Allergies:  Negative for polydipsia.  Psychiatric/Behavioral:  Positive for memory loss.     EKGs/Labs/Other Studies Reviewed:    The following studies were reviewed today: CTA  06/14/20: FINDINGS: Cardiovascular: There is mild cardiomegaly. No pericardial effusion. Mild atherosclerotic calcification of the thoracic aorta. No pulmonary artery embolus identified.   Mediastinum/Nodes: No hilar or mediastinal adenopathy. Calcified left hilar granuloma. The esophagus is grossly unremarkable. Multinodular goiter.   Lungs/Pleura: Faint streaky density throughout the left lung consistent with atelectasis or scarring. No focal consolidation, pleural effusion, pneumothorax. Left upper lobe calcified granuloma. The central airways are patent.   Upper Abdomen: Scattered calcified splenic and hepatic granuloma. Indeterminate partially visualized hypodense lesion from the superior pole of the right kidney. Left adrenal thickening/hyperplasia.   Musculoskeletal: No chest wall abnormality. No acute or significant osseous findings.   Review of the MIP images confirms the above findings.   IMPRESSION: 1. No acute intrathoracic pathology. No CT evidence of pulmonary artery embolus. 2. Mild cardiomegaly. 3. Aortic Atherosclerosis (ICD10-I70.0).   EKG:  EKG is  ordered today.  The ekg ordered today demonstrates 09/05/20 NSR with LVH  Recent Labs: 06/14/2020: TSH 0.062 09/04/2020: ALT 12; B Natriuretic Peptide 154.4; BUN 25; Creatinine, Ser 0.89; Hemoglobin 11.6; Platelets 193; Potassium 3.9; Sodium 139  Recent Lipid Panel No results found for: CHOL, TRIG, HDL, CHOLHDL, VLDL, LDLCALC, LDLDIRECT        Physical Exam:    VS:  BP 140/80   Pulse 73   Ht 5\' 4"  (1.626 m)   Wt 184 lb (83.5 kg)   LMP 03/16/1963   SpO2 97%   BMI 31.58 kg/m     Wt Readings from Last 3 Encounters:  09/10/20 184 lb (83.5 kg)  09/04/20 176 lb 5.9 oz (80 kg)  06/14/20 176 lb 5.9 oz (80 kg)     GEN: Elderly female, NAD HEENT: Normal NECK: No JVD; No carotid bruits CARDIAC: RRR, no murmurs, rubs, gallops RESPIRATORY:  Clear to auscultation without rales, wheezing or rhonchi  ABDOMEN:  Soft, non-tender, non-distended MUSCULOSKELETAL:  1+ pitting edema to mid shin, warm SKIN: Warm and dry NEUROLOGIC:  Alert and oriented x 3 PSYCHIATRIC:  Normal affect   ASSESSMENT:    1. Chest pain of uncertain etiology   2. SOB (shortness of breath)   3. Bilateral lower extremity edema   4. Primary hypertension   5. Advanced dementia ([place])    PLAN:    In order of problems listed above:  #Chest Pain: #DOE: Recent ER visit reassuring with normal troponins and no ECG changes. Suspect symptoms may be related to volume overload given LE edema and concerns for orthopnea. BNP slightly elevated at 154 on last ER visit. Given advanced age and dementia, recommend proceeding with medical management at this time. Will check TTE to assess LVEF/wall motion and adjust medications as needed.  -Check TTE -Start lasix 20mg  daily -Stop maxzide -Allergy to ACE; will start amlodipine for HTN instead -Adjust medications pending TTE findings  #LE Edema: Patient with 1+ pitting edema on exam today. Extremities warm.  Plan for TTE as above. -Check TTE -Stop triameterene-HCTZ 37.5-25mg  daily -Start lasix 20mg  daily -BMET next week  #HTN: Elevated today at 140/80s.  -Stop triameterene-HCTZ 37.5-25mg  daily -Start amlodipine 5mg  daily -Start lasix 20mg  daily as above -Has allergy to ACE  #Advanced Dementia: -Will pursue conservative measures at this time    Medication Adjustments/Labs and Tests Ordered: Current medicines are reviewed at length with the patient today.  Concerns regarding medicines are outlined above.  Orders Placed This Encounter  Procedures   Basic Metabolic Panel (BMET)   ECHOCARDIOGRAM COMPLETE   Meds ordered this encounter  Medications   furosemide (LASIX) 20 MG tablet    Sig: Take 1 tablet (20 mg total) by mouth daily.    Dispense:  90 tablet    Refill:  1   amLODipine (NORVASC) 5 MG tablet    Sig: Take 1 tablet (5 mg total) by mouth daily.    Dispense:  90  tablet    Refill:  1    There are no Patient Instructions on file for this visit.   Signed, Freada Bergeron, MD  09/10/2020 9:26 AM

## 2020-09-09 ENCOUNTER — Ambulatory Visit: Payer: Medicare Other | Admitting: Podiatry

## 2020-09-10 ENCOUNTER — Ambulatory Visit (INDEPENDENT_AMBULATORY_CARE_PROVIDER_SITE_OTHER): Payer: Medicare Other | Admitting: Cardiology

## 2020-09-10 ENCOUNTER — Encounter (HOSPITAL_BASED_OUTPATIENT_CLINIC_OR_DEPARTMENT_OTHER): Payer: Self-pay | Admitting: Cardiology

## 2020-09-10 ENCOUNTER — Other Ambulatory Visit: Payer: Self-pay

## 2020-09-10 VITALS — BP 140/80 | HR 73 | Ht 64.0 in | Wt 184.0 lb

## 2020-09-10 DIAGNOSIS — R600 Localized edema: Secondary | ICD-10-CM | POA: Diagnosis not present

## 2020-09-10 DIAGNOSIS — R079 Chest pain, unspecified: Secondary | ICD-10-CM

## 2020-09-10 DIAGNOSIS — F03C0 Unspecified dementia, severe, without behavioral disturbance, psychotic disturbance, mood disturbance, and anxiety: Secondary | ICD-10-CM

## 2020-09-10 DIAGNOSIS — F0390 Unspecified dementia without behavioral disturbance: Secondary | ICD-10-CM

## 2020-09-10 DIAGNOSIS — R0602 Shortness of breath: Secondary | ICD-10-CM

## 2020-09-10 DIAGNOSIS — I10 Essential (primary) hypertension: Secondary | ICD-10-CM | POA: Diagnosis not present

## 2020-09-10 MED ORDER — FUROSEMIDE 20 MG PO TABS: 20.0000 mg | ORAL_TABLET | Freq: Every day | ORAL | 1 refills | Status: DC

## 2020-09-10 MED ORDER — AMLODIPINE BESYLATE 5 MG PO TABS: 5.0000 mg | ORAL_TABLET | Freq: Every day | ORAL | 1 refills | Status: DC

## 2020-09-10 NOTE — Patient Instructions (Signed)
Medication Instructions:   STOP TAKING MAXIDE NOW  START TAKING LASIX 20 MG BY MOUTH DAILY  START TAKING AMLODIPINE 5 MG BY MOUTH DAILY  *If you need a refill on your cardiac medications before your next appointment, please call your pharmacy*   Lab Work:  [facility] OFFICE--TO CHECK BMET  If you have labs (blood work) drawn today and your tests are completely normal, you will receive your results only by: [facility] (if you have MyChart) OR A paper copy in the mail If you have any lab test that is abnormal or we need to change your treatment, we will call you to review the results.   Testing/Procedures:  Your physician has requested that you have an echocardiogram. Echocardiography is a painless test that uses sound waves to create images of your heart. It provides your doctor with information about the size and shape of your heart and how well your heart's chambers and valves are working. This procedure takes approximately one hour. There are no restrictions for this procedure.   Follow-Up:  4 MONTHS WITH AN EXTENDER AT [facility]

## 2020-09-18 ENCOUNTER — Other Ambulatory Visit: Payer: Medicare Other

## 2020-09-18 ENCOUNTER — Other Ambulatory Visit: Payer: Self-pay

## 2020-09-18 DIAGNOSIS — R0602 Shortness of breath: Secondary | ICD-10-CM

## 2020-09-18 DIAGNOSIS — R079 Chest pain, unspecified: Secondary | ICD-10-CM

## 2020-09-18 DIAGNOSIS — R600 Localized edema: Secondary | ICD-10-CM

## 2020-09-19 ENCOUNTER — Encounter: Payer: Self-pay | Admitting: Neurology

## 2020-09-19 ENCOUNTER — Ambulatory Visit (INDEPENDENT_AMBULATORY_CARE_PROVIDER_SITE_OTHER): Payer: Medicare Other | Admitting: Neurology

## 2020-09-19 VITALS — BP 118/65 | HR 64 | Ht 64.0 in | Wt 185.4 lb

## 2020-09-19 DIAGNOSIS — F0150 Vascular dementia without behavioral disturbance: Secondary | ICD-10-CM | POA: Diagnosis not present

## 2020-09-19 LAB — BASIC METABOLIC PANEL
BUN/Creatinine Ratio: 26 (ref 12–28)
BUN: 21 mg/dL (ref 10–36)
CO2: 26 mmol/L (ref 20–29)
Calcium: 9.2 mg/dL (ref 8.7–10.3)
Chloride: 103 mmol/L (ref 96–106)
Creatinine, Ser: 0.82 mg/dL (ref 0.57–1.00)
Glucose: 82 mg/dL (ref 65–99)
Potassium: 4.6 mmol/L (ref 3.5–5.2)
Sodium: 144 mmol/L (ref 134–144)
eGFR: 67 mL/min/{1.73_m2} (ref >59)

## 2020-09-19 NOTE — Progress Notes (Signed)
NEUROLOGY CONSULTATION NOTE  Kerry Garcia MRN: [id number] DOB: 09-27-1925  Referring provider: Dr. [name] Primary care provider: Dr. [name]  Reason for consult:  memory loss  Dear Dr Delfina Redwood:  Thank you for your kind referral of Kerry Garcia for consultation of the above symptoms. Although her history is well known to you, please allow me to reiterate it for the purpose of our medical record. The patient was accompanied to the clinic by her son Kerry Garcia who also provides collateral information. Records and images were personally reviewed where available.   HISTORY OF PRESENT ILLNESS: This is a 87 year old right-handed woman with a history of hypertension, hyperlipidemia, hypothyroidism, depression, dementia, presenting for evaluation of memory loss. On her visit with Dr. Delfina Redwood in 06/2020, family reported that the son who was caring for her died from an MI. Family was having a hard time caring for her as she will not allow certain things to be done. MMSE 23/30 in 06/2020. She was in the ER on 09/04/20 for chest pain, this is her third ER visit for the same concern. She had a head CT without contrast at that time which I personally reviewed, no acute changes, there was mild to moderate diffuse atrophy, moderate to advanced chronic microvascular disease.  She is accompanied by her son Kerry Garcia, her POA, who helps supplement the history today. She states her memory is "bad." Family started noticing changes a year ago where she would not remember certain events or make statements that they could not imagine where they were coming from. She had been living with her other son who passed away a few months ago. She had been managing her own medications and was forgetting to take them. Kerry Garcia and his wife took over 3 months ago. He also took over finances 4 months ago, most bills are drafted, however she was getting scammed, she got a letter that the [facility] had not been paid, she paid it,  then got a second letter so they took over. He states he has taken everything over since July. She stopped driving a couple of years ago due to numbness in the bottom of her feet. She needs help with dressing and bathing due to mobility/pain issues. Since moved in with Kerry Garcia and his wife 2 weeks ago, and since then he notes she is a little bit more alert, it seems like her physical condition is also improving, her mobility and mood are better. She states her mood is "not as bad as it could be, a lot going on right now." There is some paranoia saying she does not know where she is or how did she get there, worse in the mornings. No hallucinations. She has low back pain. She has bowel and bladder incontinence and wears Depends. She has occasional numbness and tingling in her hands and feet. She denies any headaches, vision changes, no falls. She has occasional dizziness. She ambulates with her walker. Sleep is okay. No family history of dementia, no significant head injuries or alcohol use.    PAST MEDICAL HISTORY: Past Medical History:  Diagnosis Date   Carpal tunnel syndrome    Dementia ([place])    Depression    Fibromyalgia    Hypertension    Hypothyroidism    Incontinence    Spinal stenosis     PAST SURGICAL HISTORY: Past Surgical History:  Procedure Laterality Date   BREAST LUMPECTOMY  1965   SPINE SURGERY      MEDICATIONS: Current  Outpatient Medications on File Prior to Visit  Medication Sig Dispense Refill   amLODipine (NORVASC) 5 MG tablet Take 1 tablet (5 mg total) by mouth daily. 90 tablet 1   celecoxib (CELEBREX) 200 MG capsule Take 200 mg by mouth daily at 2 PM.     COMBIGAN 0.2-0.5 % ophthalmic solution Place 1 drop into the right eye every 12 (twelve) hours.      escitalopram (LEXAPRO) 20 MG tablet Take 20 mg by mouth at bedtime.     furosemide (LASIX) 20 MG tablet Take 1 tablet (20 mg total) by mouth daily. 90 tablet 1   No current facility-administered medications on file  prior to visit.    ALLERGIES: Allergies  Allergen Reactions   Ibuprofen Other (See Comments) and Anaphylaxis    Patient stated she had lower edema in her legs and MD mentioned a possible allergic reactiona Patient stated she had lower edema in her legs and MD mentioned a possible allergic reactiona   Ciprofloxacin Other (See Comments)   Fish Allergy Other (See Comments)    Per patient Possible allergy to trout. Lower edema in right leg.   Lisinopril Other (See Comments)   Other Other (See Comments)    Per patient Possible allergy to trout. Lower edema in right leg.   Sulfa Antibiotics     Other reaction(s): unknown    FAMILY HISTORY: Family History  Problem Relation Age of Onset   Heart failure Father    Prostate cancer Son     SOCIAL HISTORY: Social History   Socioeconomic History   Marital status: Widowed    Spouse name: Not on file   Number of children: Not on file   Years of education: Not on file   Highest education level: Not on file  Occupational History   Not on file  Tobacco Use   Smoking status: Never   Smokeless tobacco: Never  Substance and Sexual Activity   Alcohol use: No   Drug use: No   Sexual activity: Not on file  Other Topics Concern   Not on file  Social History Narrative   ** Merged History Encounter **       Social Determinants of Health   Financial Resource Strain: Not on file  Food Insecurity: Not on file  Transportation Needs: Not on file  Physical Activity: Not on file  Stress: Not on file  Social Connections: Not on file  Intimate Partner Violence: Not on file     PHYSICAL EXAM: Vitals:   09/19/20 1037  BP: 118/65  Pulse: 64  SpO2: 98%   General: No acute distress Head:  Normocephalic/atraumatic Skin/Extremities: No rash, no edema Neurological Exam: Mental status: alert and oriented to person, time, city/state. No dysarthria or aphasia, Fund of knowledge is reduced.  Recent and remote memory are impaired.  Attention  and concentration are reduced.  Able to name objects, difficulty with repetition. MMSE 17/30 MMSE - Mini Mental State Exam 09/19/2020  Orientation to time 4  Orientation to Place 2  Registration 3  Attention/ Calculation 0  Recall 0  Language- name 2 objects 2  Language- repeat 0  Language- follow 3 step command 3  Language- read & follow direction 1  Write a sentence 1  Copy design 1  Total score 17    Cranial nerves: CN I: not tested CN II: pupils equal, round and reactive to light, visual fields intact CN III, IV, VI:  full range of motion, no nystagmus, no ptosis CN V:  facial sensation intact CN VII: upper and lower face symmetric CN VIII: hearing intact to conversation CN XI: sternocleidomastoid and trapezius muscles intact CN XII: tongue midline Bulk & Tone: normal, no fasciculations. Motor: 5/5 throughout with no pronator drift. Sensation: intact to light touch, cold, vibration sense.  No extinction to double simultaneous stimulation Deep Tendon Reflexes: +2 on both UE, +1 both LE Cerebellar: no incoordination on finger to nose testing Gait: slow and cautious with walker, no ataxia Tremor: none   IMPRESSION: This is a 87 year old right-handed woman with a history of hypertension, hyperlipidemia, hypothyroidism, depression, dementia, presenting for evaluation of memory loss. Her neurological exam is non-focal, MMSE today 17/30. Head CT shows mild to moderate diffuse atrophy, moderate to advanced chronic microvascular disease. Discussed the diagnosis of dementia, likely vascular. Discussed prognosis and management, at her age side effects/risks of dementia medications outweigh potential benefits. Discussed the importance of home safety, control of vascular risk factors, physical and brain stimulation exercises, MIND diet for overall brain health. Continue 24/7 care, resources provided to son who is now her POA and lives with her, as they need more help at home in the future.  Follow-up as needed, call for any changes.    Thank you for allowing me to participate in the care of this patient. Please do not hesitate to call for any questions or concerns.   Ellouise Newer, M.D.  CC: Dr. Delfina Redwood

## 2020-09-19 NOTE — Progress Notes (Deleted)
Assessment/Plan:   Kerry Garcia is a 87 y.o. year old female with risk factors including  advanced age, hypertension, depression and  fibromyalgia and hypothyroidism, seen today for evaluation of memory loss.  MoCA today is *** with deficiencies in visuospatial/executive, naming, memory, attention, language, abstraction, delayed recall  /5, orientation  /6    Recommendations:   Memory Loss   MRI of the brain to evaluate for bleeding, brain size and other abnormalities Neurocognitive testing to further determine what causes memory changes including sleep, stress, anxiety depression Check B12, TSH Discussed safety both in and out of the home.  Discussed the importance of regular daily schedule with inclusion of crossword puzzles to maintain brain function.  Continue to monitor mood with PCP.  Stay active at least 30 minutes at least 3 times a week.  Naps should be scheduled and should be no longer than 60 minutes and should not occur after 2 PM.  Mediterranean diet is recommended  Folllow up once results above are available   Subjective:    The patient is seen in neurologic consultation at the request of Kerry Carol, MD for the evaluation of memory.  The patient is accompanied by {relatives:[id number]} who supplements the history.  The patient is a 87 y.o. year old female who was seen at the ED on 09/04/20 for AMS,  and recurrent chest pain with palpitations with negative cardiac workup, although may have a R CHF component. She was discharged same day and follow up with Cardiology was recommended. Per chart report she has a history ofd dementia, but no neurology records are available for review.   Memory Lives with Mood Depression Irritability Sleeps Vivid Dreams Sleepwalking Bathing Dressing Medications Finances Denies living objects in unusual places. Appetite  trouble swallowing.  Cooks. Denies leaving the stove on or the faucet on. Ambulates without difficulty  without walker or cane. Drive with GPS denies getting lost.  Patient no longer drives. Denies headaches, trauma, or injuries to the head, double vision, dizziness, focal numbness or tingling, unilateral weakness or tremors. Denies urine incontinence or retention. Denies constipation or diarrhea. Denies anosmia. Denies history of OSA, ETOH   Tobacco. Family History     Allergies  Allergen Reactions   Ibuprofen Other (See Comments) and Anaphylaxis    Patient stated she had lower edema in her legs and MD mentioned a possible allergic reactiona Patient stated she had lower edema in her legs and MD mentioned a possible allergic reactiona   Ciprofloxacin Other (See Comments)   Fish Allergy Other (See Comments)    Per patient Possible allergy to trout. Lower edema in right leg.   Lisinopril Other (See Comments)   Other Other (See Comments)    Per patient Possible allergy to trout. Lower edema in right leg.   Sulfa Antibiotics     Other reaction(s): unknown    Current Outpatient Medications  Medication Instructions   amLODipine (NORVASC) 5 mg, Oral, Daily   celecoxib (CELEBREX) 200 mg, Oral, Daily   COMBIGAN 0.2-0.5 % ophthalmic solution 1 drop, Right Eye, Every 12 hours   escitalopram (LEXAPRO) 20 mg, Oral, Daily at bedtime   furosemide (LASIX) 20 mg, Oral, Daily     VITALS:  There were no vitals filed for this visit. No flowsheet data found.  HEENT:  Normocephalic, atraumatic. The mucous membranes are moist. The superficial temporal arteries are without ropiness or tenderness. Cardiovascular: Regular rate and rhythm. Lungs: Clear to auscultation bilaterally. Neck: There are no carotid bruits  noted bilaterally.  NEUROLOGICAL:  Orientation:  No flowsheet data found. Alert and oriented to person, place and time. No aphasia or dysarthria. Fund of knowledge is appropriate. Recent memory impaired and remote memory intact.  Attention and concentration are normal.  Able to name objects  and repeat phrases. Delayed recall  /5 Cranial nerves: There is good facial symmetry. Extraocular muscles are intact and visual fields are full to confrontational testing. Speech is fluent and clear. Soft palate rises symmetrically and there is no tongue deviation. Hearing is intact to conversational tone. Tone: Tone is good throughout. Sensation: Sensation is intact to light touch and pinprick throughout. Vibration is intact at the bilateral big toe.There is no extinction with double simultaneous stimulation. There is no sensory dermatomal level identified. Coordination: The patient has no difficulty with RAM's or FNF bilaterally. Normal finger to nose  Motor: Strength is 5/5 in the bilateral upper and lower extremities. There is no pronator drift. There are no fasciculations noted. DTR's: Deep tendon reflexes are 2/4 at the bilateral biceps, triceps, brachioradialis, patella and achilles.  Plantar responses are downgoing bilaterally. Gait and Station: The patient is able to ambulate without difficulty.The patient is able to heel toe walk without any difficulty.The patient is able to ambulate in a tandem fashion. The patient is able to stand in the Romberg position.   CBC Latest Ref Rng & Units 09/04/2020 06/14/2020 05/20/2019  WBC 4.0 - 10.5 K/uL 8.0 7.1 6.9  Hemoglobin 12.0 - 15.0 g/dL 11.6(L) 10.7(L) 11.4(L)  Hematocrit 36.0 - 46.0 % 38.5 33.8(L) 37.2  Platelets 150 - 400 K/uL 193 209 217     CMP Latest Ref Rng & Units 09/18/2020 09/04/2020 06/14/2020  Glucose 65 - 99 mg/dL 82 99 77  BUN 10 - 36 mg/dL 21 25(H) 18  Creatinine 0.57 - 1.00 mg/dL 0.82 0.89 0.77  Sodium 134 - 144 mmol/L 144 139 142  Potassium 3.5 - 5.2 mmol/L 4.6 3.9 4.3  Chloride 96 - 106 mmol/L 103 104 105  CO2 20 - 29 mmol/L 26 27 30   Calcium 8.7 - 10.3 mg/dL 9.2 8.7(L) 8.7(L)  Total Protein 6.5 - 8.1 g/dL - 5.9(L) 5.8(L)  Total Bilirubin 0.3 - 1.2 mg/dL - 0.4 0.6  Alkaline Phos 38 - 126 U/L - 94 82  AST 15 - 41 U/L - 24 21  ALT  0 - 44 U/L - 12 11      Thank you for allowing [place] the opportunity to participate in the care of this nice patient. Please do not hesitate to contact us for any questions or concerns.   Total time spent on today's visit was *** 60 minutes, including both face-to-face time and nonface-to-face time.  Time included that spent on review of records (prior notes available to me/labs/imaging if pertinent), discussing treatment and goals, answering patient's questions and coordinating care.  Cc:  Kerry Carol, MD  Sharene Butters 09/19/2020 8:20 AM

## 2020-09-19 NOTE — Patient Instructions (Signed)
Good to meet you! Continue with close supervision. Follow-up with Dr. Delfina Redwood.  FALL PRECAUTIONS: Be cautious when walking. Scan the area for obstacles that may increase the risk of trips and falls. When getting up in the mornings, sit up at the edge of the bed for a few minutes before getting out of bed. Consider elevating the bed at the head end to avoid drop of blood pressure when getting up. Walk always in a well-lit room (use night lights in the walls). Avoid area rugs or power cords from appliances in the middle of the walkways. Use a walker or a cane if necessary and consider physical therapy for balance exercise. Get your eyesight checked regularly.  HOME SAFETY: Consider the safety of the kitchen when operating appliances like stoves, microwave oven, and blender. Consider having supervision and share cooking responsibilities until no longer able to participate in those. Accidents with firearms and other hazards in the house should be identified and addressed as well.  ABILITY TO BE LEFT ALONE: If patient is unable to contact 911 operator, consider using LifeLine, or when the need is there, arrange for someone to stay with patients. Smoking is a fire hazard, consider supervision or cessation. Risk of wandering should be assessed by caregiver and if detected at any point, supervision and safe proof recommendations should be instituted.   RECOMMENDATIONS FOR ALL PATIENTS WITH MEMORY PROBLEMS: 1. Continue to exercise (Recommend 30 minutes of walking everyday, or 3 hours every week) 2. Increase social interactions - continue going to [place] and enjoy social gatherings with friends and family 3. Eat healthy, avoid fried foods and eat more fruits and vegetables 4. Maintain adequate blood pressure, blood sugar, and blood cholesterol level. Reducing the risk of stroke and cardiovascular disease also helps promoting better memory. 5. Avoid stressful situations. Live a simple life and avoid aggravations.  Organize your time and prepare for the next day in anticipation. 6. Sleep well, avoid any interruptions of sleep and avoid any distractions in the bedroom that may interfere with adequate sleep quality 7. Avoid sugar, avoid sweets as there is a strong link between excessive sugar intake, diabetes, and cognitive impairment We discussed the Mediterranean diet, which has been shown to help patients reduce the risk of progressive memory disorders and reduces cardiovascular risk. This includes eating fish, eat fruits and green leafy vegetables, nuts like almonds and hazelnuts, walnuts, and also use olive oil. Avoid fast foods and fried foods as much as possible. Avoid sweets and sugar as sugar use has been linked to worsening of memory function.  There is always a concern of gradual progression of memory problems. If this is the case, then we may need to adjust level of care according to patient needs. Support, both to the patient and caregiver, should then be put into place.       Mediterranean Diet  Why follow it? Research shows. Those who follow the Mediterranean diet have a reduced risk of heart disease  The diet is associated with a reduced incidence of Parkinson's and Alzheimer's diseases People following the diet may have longer life expectancies and lower rates of chronic diseases  The Dietary Guidelines for Americans recommends the Mediterranean diet as an eating plan to promote health and prevent disease  What Is the Mediterranean Diet?  Healthy eating plan based on typical foods and recipes of Mediterranean-style cooking The diet is primarily a plant based diet; these foods should make up a majority of meals   Starches - Plant based  foods should make up a majority of meals - They are an important sources of vitamins, minerals, energy, antioxidants, and fiber - Choose whole grains, foods high in fiber and minimally processed items  - Typical grain sources include wheat, oats, barley,  corn, brown rice, bulgar, farro, millet, polenta, couscous  - Various types of beans include chickpeas, lentils, fava beans, black beans, white beans   Fruits  Veggies - Large quantities of antioxidant rich fruits & veggies; 6 or more servings  - Vegetables can be eaten raw or lightly drizzled with oil and cooked  - Vegetables common to the traditional Mediterranean Diet include: artichokes, arugula, beets, broccoli, brussel sprouts, cabbage, carrots, celery, collard greens, cucumbers, eggplant, kale, leeks, lemons, lettuce, mushrooms, okra, onions, peas, peppers, potatoes, pumpkin, radishes, rutabaga, shallots, spinach, sweet potatoes, turnips, zucchini - Fruits common to the Mediterranean Diet include: apples, apricots, avocados, cherries, clementines, dates, figs, grapefruits, grapes, melons, nectarines, oranges, peaches, pears, pomegranates, strawberries, tangerines  Fats - Replace butter and margarine with healthy oils, such as olive oil, canola oil, and tahini  - Limit nuts to no more than a handful a day  - Nuts include walnuts, almonds, pecans, pistachios, pine nuts  - Limit or avoid candied, honey roasted or heavily salted nuts - Olives are central to the [employer] - can be eaten whole or used in a variety of dishes   Meats Protein - Limiting red meat: no more than a few times a month - When eating red meat: choose lean cuts and keep the portion to the size of deck of cards - Eggs: approx. 0 to 4 times a week  - Fish and lean poultry: at least 2 a week  - Healthy protein sources include, chicken, [place], lean beef, lamb - Increase intake of seafood such as tuna, salmon, trout, mackerel, shrimp, scallops - Avoid or limit high fat processed meats such as sausage and bacon  Dairy - Include moderate amounts of low fat dairy products  - Focus on healthy dairy such as fat free yogurt, skim milk, low or reduced fat cheese - Limit dairy products higher in fat such as whole or 2% milk,  cheese, ice cream  Alcohol - Moderate amounts of red wine is ok  - No more than 5 oz daily for women (all ages) and men older than age 65  - No more than 10 oz of wine daily for men younger than 79  Other - Limit sweets and other desserts  - Use herbs and spices instead of salt to flavor foods  - Herbs and spices common to the traditional Mediterranean Diet include: basil, bay leaves, chives, cloves, cumin, fennel, garlic, lavender, marjoram, mint, oregano, parsley, pepper, rosemary, sage, savory, sumac, tarragon, thyme   It's not just a diet, it's a lifestyle:  The Mediterranean diet includes lifestyle factors typical of those in the region  Foods, drinks and meals are best eaten with others and savored Daily physical activity is important for overall good health This could be strenuous exercise like running and aerobics This could also be more leisurely activities such as walking, housework, yard-work, or taking the stairs Moderation is the key; a balanced and healthy diet accommodates most foods and drinks Consider portion sizes and frequency of consumption of certain foods   Meal Ideas & Options:  Breakfast:  Whole wheat toast or whole wheat English muffins with peanut butter & hard boiled egg Steel cut oats topped with apples & cinnamon and skim milk  Fresh  fruit: banana, strawberries, melon, berries, peaches  Smoothies: strawberries, bananas, greek yogurt, peanut butter Low fat greek yogurt with blueberries and granola  Egg white omelet with spinach and mushrooms Breakfast couscous: whole wheat couscous, apricots, skim milk, cranberries  Sandwiches:  Hummus and grilled vegetables (peppers, zucchini, squash) on whole wheat bread   Grilled chicken on whole wheat pita with lettuce, tomatoes, cucumbers or tzatziki  [place] salad on whole wheat bread: tuna salad made with greek yogurt, olives, red peppers, capers, green onions Garlic rosemary lamb pita: lamb sauted with garlic, rosemary,  salt & pepper; add lettuce, cucumber, greek yogurt to pita - flavor with lemon juice and black pepper  Seafood:  Mediterranean grilled salmon, seasoned with garlic, basil, parsley, lemon juice and black pepper Shrimp, lemon, and spinach whole-grain pasta salad made with low fat greek yogurt  Seared scallops with lemon orzo  Seared tuna steaks seasoned salt, pepper, coriander topped with tomato mixture of olives, tomatoes, olive oil, minced garlic, parsley, green onions and cappers  Meats:  Herbed greek chicken salad with kalamata olives, cucumber, feta  Red bell peppers stuffed with spinach, bulgur, lean ground beef (or lentils) & topped with feta   Kebabs: skewers of chicken, tomatoes, onions, zucchini, squash  [place] burgers: made with red onions, mint, dill, lemon juice, feta cheese topped with roasted red peppers Vegetarian Cucumber salad: cucumbers, artichoke hearts, celery, red onion, feta cheese, tossed in olive oil & lemon juice  Hummus and whole grain pita points with a greek salad (lettuce, tomato, feta, olives, cucumbers, red onion) Lentil soup with celery, carrots made with vegetable broth, garlic, salt and pepper  Tabouli salad: parsley, bulgur, mint, scallions, cucumbers, tomato, radishes, lemon juice, olive oil, salt and pepper.

## 2020-09-23 ENCOUNTER — Encounter (HOSPITAL_BASED_OUTPATIENT_CLINIC_OR_DEPARTMENT_OTHER): Payer: Self-pay

## 2020-09-26 ENCOUNTER — Other Ambulatory Visit: Payer: Self-pay

## 2020-09-26 ENCOUNTER — Ambulatory Visit (HOSPITAL_COMMUNITY): Payer: Medicare Other | Attending: Cardiovascular Disease

## 2020-09-26 ENCOUNTER — Encounter: Payer: Self-pay | Admitting: Neurology

## 2020-09-26 DIAGNOSIS — R0602 Shortness of breath: Secondary | ICD-10-CM | POA: Diagnosis not present

## 2020-09-26 DIAGNOSIS — R079 Chest pain, unspecified: Secondary | ICD-10-CM | POA: Diagnosis not present

## 2020-09-26 DIAGNOSIS — R600 Localized edema: Secondary | ICD-10-CM | POA: Diagnosis not present

## 2020-09-26 LAB — ECHOCARDIOGRAM COMPLETE
AV Area VTI: 1.43 cm2
AV Area mean vel: 1.38 cm2
Area-P 1/2: 2.5 cm2
P 1/2 time: 377 msec
S' Lateral: 3.3 cm

## 2020-09-30 ENCOUNTER — Telehealth: Payer: Self-pay | Admitting: *Deleted

## 2020-09-30 DIAGNOSIS — Z79899 Other long term (current) drug therapy: Secondary | ICD-10-CM

## 2020-09-30 DIAGNOSIS — R0602 Shortness of breath: Secondary | ICD-10-CM

## 2020-09-30 DIAGNOSIS — R600 Localized edema: Secondary | ICD-10-CM

## 2020-09-30 NOTE — Telephone Encounter (Signed)
Called the pts daughter in law Beverlee Nims (on [place]) and informed her that per Dr. Johney Frame, being the pts echo looked good, and the pt is continuing to have swelling and sob on lasix, and she would like for her to come into the office sometime this week, to have lab done to check a PRO-BNP. Beverlee Nims states she can bring the pt by the office tomorrow for labs.  Scheduled the pt to come into the office for lab on tomorrow 7/20 to recheck a PRO-BNP.  Beverlee Nims verbalized understanding and agrees with this plan.

## 2020-09-30 NOTE — Telephone Encounter (Signed)
-----   Message from Loren Racer, RN sent at 09/29/2020  2:13 PM EDT ----- Spoke with pt and reviewed results.  Pt states breathing and swelling are no better on Furosemide.  Advised I will update Dr. Johney Frame and we would call back if she has any new recommendations.

## 2020-10-01 ENCOUNTER — Other Ambulatory Visit: Payer: Self-pay

## 2020-10-01 ENCOUNTER — Other Ambulatory Visit: Payer: Medicare Other

## 2020-10-01 DIAGNOSIS — R0602 Shortness of breath: Secondary | ICD-10-CM

## 2020-10-01 DIAGNOSIS — Z79899 Other long term (current) drug therapy: Secondary | ICD-10-CM

## 2020-10-01 DIAGNOSIS — R600 Localized edema: Secondary | ICD-10-CM

## 2020-10-02 ENCOUNTER — Telehealth: Payer: Self-pay | Admitting: *Deleted

## 2020-10-02 ENCOUNTER — Encounter: Payer: Self-pay | Admitting: Neurology

## 2020-10-02 LAB — PRO B NATRIURETIC PEPTIDE: NT-Pro BNP: 661 pg/mL (ref 0–738)

## 2020-10-02 MED ORDER — FUROSEMIDE 20 MG PO TABS: 20.0000 mg | ORAL_TABLET | Freq: Every day | ORAL | 0 refills | Status: DC

## 2020-10-02 NOTE — Telephone Encounter (Signed)
-----   Message from Freada Bergeron, MD sent at 10/02/2020 11:34 AM EDT ----- Her fluid levels look great! She can take an extra 20mg  of lasix up to 3x/week to help with the swelling.

## 2020-10-02 NOTE — Telephone Encounter (Signed)
Spoke with the pt and daughter in law Beverlee Nims (on [place] and helps take care of the pt), and informed them of her lab results and recommendations per Dr. Johney Frame. Daughter in law states they have enough supply of lasix on hand at this time, so when we call this in, place a note to the pharmacy that they will call for further refills.  Note placed to pharmacy. Daughter-in-law verbalized understanding and agrees with this plan.

## 2020-12-01 ENCOUNTER — Ambulatory Visit
Admission: RE | Admit: 2020-12-01 | Discharge: 2020-12-01 | Disposition: A | Discharge status: Home / Self Care | Payer: Medicare Other | Source: Ambulatory Visit | Attending: Internal Medicine | Admitting: Internal Medicine

## 2020-12-01 ENCOUNTER — Other Ambulatory Visit: Payer: Self-pay | Admitting: Internal Medicine

## 2020-12-01 DIAGNOSIS — R7612 Nonspecific reaction to cell mediated immunity measurement of gamma interferon antigen response without active tuberculosis: Secondary | ICD-10-CM

## 2020-12-09 ENCOUNTER — Telehealth: Payer: Self-pay | Admitting: Cardiology

## 2020-12-09 DIAGNOSIS — R079 Chest pain, unspecified: Secondary | ICD-10-CM

## 2020-12-09 DIAGNOSIS — R0602 Shortness of breath: Secondary | ICD-10-CM

## 2020-12-09 DIAGNOSIS — R600 Localized edema: Secondary | ICD-10-CM

## 2020-12-09 MED ORDER — AMLODIPINE BESYLATE 5 MG PO TABS
5.0000 mg | ORAL_TABLET | Freq: Every day | ORAL | 2 refills | Status: DC
Start: 2020-12-09 — End: 2021-03-10

## 2020-12-09 MED ORDER — FUROSEMIDE 20 MG PO TABS: 20.0000 mg | ORAL_TABLET | Freq: Every day | ORAL | 2 refills | Status: DC

## 2020-12-09 NOTE — Telephone Encounter (Signed)
 *  STAT* If patient is at the pharmacy, call can be transferred to refill team.   1. Which medications need to be refilled? (please list name of each medication and dose if known)   amLODipine (NORVASC) 5 MG tablet   furosemide (LASIX) 20 MG tablet  2. Which pharmacy/location (including street and city if local pharmacy) is medication to be sent to? [facility]  3. Do they need a 30 day or 90 day supply? 90 days  They need it today, getting ready to mail the pt's meds

## 2020-12-09 NOTE — Telephone Encounter (Signed)
Pt's medication was sent to pt's pharmacy as requested. Confirmation received.  °

## 2020-12-18 ENCOUNTER — Ambulatory Visit: Payer: Medicare Other | Admitting: Internal Medicine

## 2020-12-18 DIAGNOSIS — F0390 Unspecified dementia without behavioral disturbance: Secondary | ICD-10-CM | POA: Insufficient documentation

## 2020-12-18 DIAGNOSIS — E039 Hypothyroidism, unspecified: Secondary | ICD-10-CM | POA: Insufficient documentation

## 2020-12-18 DIAGNOSIS — R7612 Nonspecific reaction to cell mediated immunity measurement of gamma interferon antigen response without active tuberculosis: Secondary | ICD-10-CM | POA: Insufficient documentation

## 2021-01-02 ENCOUNTER — Ambulatory Visit (HOSPITAL_BASED_OUTPATIENT_CLINIC_OR_DEPARTMENT_OTHER): Payer: Medicare Other | Admitting: Family

## 2021-03-10 ENCOUNTER — Other Ambulatory Visit: Payer: Self-pay

## 2021-03-10 DIAGNOSIS — R079 Chest pain, unspecified: Secondary | ICD-10-CM

## 2021-03-10 DIAGNOSIS — R0602 Shortness of breath: Secondary | ICD-10-CM

## 2021-03-10 DIAGNOSIS — R600 Localized edema: Secondary | ICD-10-CM

## 2021-03-10 MED ORDER — AMLODIPINE BESYLATE 5 MG PO TABS
5.0000 mg | ORAL_TABLET | Freq: Every day | ORAL | 1 refills | Status: DC
Start: 2021-03-10 — End: 2021-12-14

## 2021-06-15 ENCOUNTER — Other Ambulatory Visit: Payer: Self-pay

## 2021-06-15 MED ORDER — FUROSEMIDE 20 MG PO TABS: 20.0000 mg | ORAL_TABLET | Freq: Every day | ORAL | 0 refills | Status: DC

## 2021-08-11 ENCOUNTER — Other Ambulatory Visit: Payer: Self-pay

## 2021-08-26 ENCOUNTER — Encounter: Payer: Self-pay | Admitting: Podiatry

## 2021-08-26 ENCOUNTER — Other Ambulatory Visit: Payer: Self-pay

## 2021-08-26 ENCOUNTER — Ambulatory Visit (INDEPENDENT_AMBULATORY_CARE_PROVIDER_SITE_OTHER): Payer: Medicare Other | Admitting: Podiatry

## 2021-08-26 DIAGNOSIS — F419 Anxiety disorder, unspecified: Secondary | ICD-10-CM | POA: Insufficient documentation

## 2021-08-26 DIAGNOSIS — F3342 Major depressive disorder, recurrent, in full remission: Secondary | ICD-10-CM | POA: Insufficient documentation

## 2021-08-26 DIAGNOSIS — R269 Unspecified abnormalities of gait and mobility: Secondary | ICD-10-CM | POA: Insufficient documentation

## 2021-08-26 DIAGNOSIS — M79675 Pain in left toe(s): Secondary | ICD-10-CM

## 2021-08-26 DIAGNOSIS — M791 Myalgia, unspecified site: Secondary | ICD-10-CM | POA: Insufficient documentation

## 2021-08-26 DIAGNOSIS — R3981 Functional urinary incontinence: Secondary | ICD-10-CM | POA: Insufficient documentation

## 2021-08-26 DIAGNOSIS — M79674 Pain in right toe(s): Secondary | ICD-10-CM

## 2021-08-26 DIAGNOSIS — I739 Peripheral vascular disease, unspecified: Secondary | ICD-10-CM

## 2021-08-26 DIAGNOSIS — R413 Other amnesia: Secondary | ICD-10-CM | POA: Insufficient documentation

## 2021-08-26 DIAGNOSIS — N8110 Cystocele, unspecified: Secondary | ICD-10-CM | POA: Insufficient documentation

## 2021-08-26 DIAGNOSIS — B351 Tinea unguium: Secondary | ICD-10-CM | POA: Diagnosis not present

## 2021-08-26 DIAGNOSIS — E785 Hyperlipidemia, unspecified: Secondary | ICD-10-CM | POA: Insufficient documentation

## 2021-08-26 DIAGNOSIS — N3941 Urge incontinence: Secondary | ICD-10-CM | POA: Insufficient documentation

## 2021-08-26 DIAGNOSIS — F4320 Adjustment disorder, unspecified: Secondary | ICD-10-CM | POA: Insufficient documentation

## 2021-08-26 DIAGNOSIS — E7800 Pure hypercholesterolemia, unspecified: Secondary | ICD-10-CM | POA: Insufficient documentation

## 2021-08-26 DIAGNOSIS — F324 Major depressive disorder, single episode, in partial remission: Secondary | ICD-10-CM | POA: Insufficient documentation

## 2021-08-26 DIAGNOSIS — K8020 Calculus of gallbladder without cholecystitis without obstruction: Secondary | ICD-10-CM | POA: Insufficient documentation

## 2021-08-26 MED ORDER — FUROSEMIDE 20 MG PO TABS: 20.0000 mg | ORAL_TABLET | Freq: Every day | ORAL | 0 refills | Status: DC

## 2021-08-26 NOTE — Progress Notes (Signed)
This patient presents to the office with chief complaint of long thick painful nails.  Patient says the nails are painful walking and wearing shoes.  This patient is unable to self treat.  This patient is unable to trim her nails since she is unable to reach her nails.  She presents to the office for preventative foot care services.  General Appearance  Alert, conversant and in no acute stress.  Vascular  Dorsalis pedis and posterior tibial  pulses are weakly palpable  bilaterally.  Capillary return is within normal limits  bilaterally. Temperature is within normal limits  bilaterally.  Neurologic  Senn-Weinstein monofilament wire test within normal limits  bilaterally. Muscle power within normal limits bilaterally.  Nails Thick disfigured discolored nails with subungual debris  from hallux to fifth toes bilaterally. No evidence of bacterial infection or drainage bilaterally.  Orthopedic  No limitations of motion  feet .  No crepitus or effusions noted.  No bony pathology or digital deformities noted.  HAV  B/L.  Skin  normotropic skin with no porokeratosis noted bilaterally.  No signs of infections or ulcers noted.     Onychomycosis  Nails  B/L.  Pain in right toes  Pain in left toes  Debridement of nails both feet with a nail nipper  Followed with trimming  the nails with dremel tool.    RTC 3 months.   Jamaiya Tunnell DPM  

## 2021-09-16 ENCOUNTER — Other Ambulatory Visit: Payer: Self-pay

## 2021-12-02 ENCOUNTER — Ambulatory Visit (INDEPENDENT_AMBULATORY_CARE_PROVIDER_SITE_OTHER): Payer: Medicare Other | Admitting: Podiatry

## 2021-12-02 ENCOUNTER — Encounter: Payer: Self-pay | Admitting: Podiatry

## 2021-12-02 DIAGNOSIS — I739 Peripheral vascular disease, unspecified: Secondary | ICD-10-CM

## 2021-12-02 DIAGNOSIS — M79675 Pain in left toe(s): Secondary | ICD-10-CM | POA: Diagnosis not present

## 2021-12-02 DIAGNOSIS — M79674 Pain in right toe(s): Secondary | ICD-10-CM

## 2021-12-02 DIAGNOSIS — B351 Tinea unguium: Secondary | ICD-10-CM

## 2021-12-02 NOTE — Progress Notes (Signed)
This patient presents to the office with chief complaint of long thick painful nails.  Patient says the nails are painful walking and wearing shoes.  This patient is unable to self treat.  This patient is unable to trim her nails since she is unable to reach her nails.  She presents to the office for preventative foot care services.  General Appearance  Alert, conversant and in no acute stress.  Vascular  Dorsalis pedis and posterior tibial  pulses are weakly palpable  bilaterally.  Capillary return is within normal limits  bilaterally. Temperature is within normal limits  bilaterally.  Neurologic  Senn-Weinstein monofilament wire test within normal limits  bilaterally. Muscle power within normal limits bilaterally.  Nails Thick disfigured discolored nails with subungual debris  from hallux to fifth toes bilaterally. No evidence of bacterial infection or drainage bilaterally.  Orthopedic  No limitations of motion  feet .  No crepitus or effusions noted.  No bony pathology or digital deformities noted.  HAV  B/L.  Skin  normotropic skin with no porokeratosis noted bilaterally.  No signs of infections or ulcers noted.     Onychomycosis  Nails  B/L.  Pain in right toes  Pain in left toes  Debridement of nails both feet with a nail nipper  Followed with trimming  the nails with dremel tool.    RTC 3 months.   Liam Cammarata DPM  

## 2021-12-10 ENCOUNTER — Ambulatory Visit
Admission: RE | Admit: 2021-12-10 | Discharge: 2021-12-10 | Disposition: A | Discharge status: Home / Self Care | Payer: Medicare Other | Source: Ambulatory Visit | Attending: Internal Medicine | Admitting: Internal Medicine

## 2021-12-10 ENCOUNTER — Other Ambulatory Visit: Payer: Self-pay | Admitting: Internal Medicine

## 2021-12-10 DIAGNOSIS — R7611 Nonspecific reaction to tuberculin skin test without active tuberculosis: Secondary | ICD-10-CM

## 2021-12-14 ENCOUNTER — Other Ambulatory Visit: Payer: Self-pay

## 2021-12-14 MED ORDER — AMLODIPINE BESYLATE 5 MG PO TABS: ORAL_TABLET | ORAL | 0 refills | Status: AC

## 2021-12-16 ENCOUNTER — Other Ambulatory Visit: Payer: Self-pay

## 2021-12-16 MED ORDER — FUROSEMIDE 20 MG PO TABS: 20.0000 mg | ORAL_TABLET | Freq: Every day | ORAL | 0 refills | Status: AC

## 2022-03-01 ENCOUNTER — Ambulatory Visit: Payer: Medicare Other | Admitting: Podiatry

## 2022-03-03 ENCOUNTER — Ambulatory Visit: Payer: Medicare Other | Admitting: Podiatry

## 2022-03-19 ENCOUNTER — Encounter: Payer: Self-pay | Admitting: Podiatry

## 2022-03-19 ENCOUNTER — Ambulatory Visit (INDEPENDENT_AMBULATORY_CARE_PROVIDER_SITE_OTHER): Payer: Medicare Other | Admitting: Podiatry

## 2022-03-19 DIAGNOSIS — B351 Tinea unguium: Secondary | ICD-10-CM | POA: Diagnosis not present

## 2022-03-19 DIAGNOSIS — M79675 Pain in left toe(s): Secondary | ICD-10-CM | POA: Diagnosis not present

## 2022-03-19 DIAGNOSIS — I739 Peripheral vascular disease, unspecified: Secondary | ICD-10-CM

## 2022-03-19 DIAGNOSIS — M79674 Pain in right toe(s): Secondary | ICD-10-CM | POA: Diagnosis not present

## 2022-03-19 NOTE — Progress Notes (Signed)
This patient presents to the office with chief complaint of long thick painful nails.  Patient says the nails are painful walking and wearing shoes.  This patient is unable to self treat.  This patient is unable to trim her nails since she is unable to reach her nails.  She presents to the office for preventative foot care services.  General Appearance  Alert, conversant and in no acute stress.  Vascular  Dorsalis pedis and posterior tibial  pulses are weakly palpable  bilaterally.  Capillary return is within normal limits  bilaterally. Temperature is within normal limits  bilaterally.  Neurologic  Senn-Weinstein monofilament wire test within normal limits  bilaterally. Muscle power within normal limits bilaterally.  Nails Thick disfigured discolored nails with subungual debris  from hallux to fifth toes bilaterally. No evidence of bacterial infection or drainage bilaterally.  Orthopedic  No limitations of motion  feet .  No crepitus or effusions noted.  No bony pathology or digital deformities noted.  HAV  B/L.  Skin  normotropic skin with no porokeratosis noted bilaterally.  No signs of infections or ulcers noted.     Onychomycosis  Nails  B/L.  Pain in right toes  Pain in left toes  Debridement of nails both feet with a nail nipper  Followed with trimming  the nails with dremel tool.    RTC 3 months.   Gardiner Barefoot DPM

## 2022-06-25 ENCOUNTER — Encounter: Payer: Self-pay | Admitting: Podiatry

## 2022-06-25 ENCOUNTER — Ambulatory Visit (INDEPENDENT_AMBULATORY_CARE_PROVIDER_SITE_OTHER): Payer: Medicare Other | Admitting: Podiatry

## 2022-06-25 DIAGNOSIS — B351 Tinea unguium: Secondary | ICD-10-CM

## 2022-06-25 DIAGNOSIS — M79674 Pain in right toe(s): Secondary | ICD-10-CM

## 2022-06-25 DIAGNOSIS — M79675 Pain in left toe(s): Secondary | ICD-10-CM | POA: Diagnosis not present

## 2022-06-25 DIAGNOSIS — I739 Peripheral vascular disease, unspecified: Secondary | ICD-10-CM

## 2022-06-25 NOTE — Progress Notes (Signed)
This patient presents to the office with chief complaint of long thick painful nails.  Patient says the nails are painful walking and wearing shoes.  This patient is unable to self treat.  This patient is unable to trim her nails since she is unable to reach her nails.  She presents to the office for preventative foot care services.  General Appearance  Alert, conversant and in no acute stress.  Vascular  Dorsalis pedis and posterior tibial  pulses are weakly palpable  bilaterally.  Capillary return is within normal limits  bilaterally. Temperature is within normal limits  bilaterally.  Neurologic  Senn-Weinstein monofilament wire test within normal limits  bilaterally. Muscle power within normal limits bilaterally.  Nails Thick disfigured discolored nails with subungual debris  from hallux to fifth toes bilaterally. No evidence of bacterial infection or drainage bilaterally.  Orthopedic  No limitations of motion  feet .  No crepitus or effusions noted.  No bony pathology or digital deformities noted.  HAV  B/L.  Skin  normotropic skin with no porokeratosis noted bilaterally.  No signs of infections or ulcers noted.     Onychomycosis  Nails  B/L.  Pain in right toes  Pain in left toes  Debridement of nails both feet with a nail nipper  Followed with trimming  the nails with dremel tool.    RTC 3 months.   Teddie Mehta DPM  

## 2023-01-20 ENCOUNTER — Emergency Department (HOSPITAL_COMMUNITY): Payer: Medicare Other

## 2023-01-20 ENCOUNTER — Emergency Department (HOSPITAL_COMMUNITY)
Admission: EM | Admit: 2023-01-20 | Discharge: 2023-01-20 | Disposition: A | Discharge status: Home / Self Care | Payer: Medicare Other | Attending: Emergency Medicine | Admitting: Emergency Medicine

## 2023-01-20 ENCOUNTER — Other Ambulatory Visit: Payer: Self-pay

## 2023-01-20 DIAGNOSIS — F0390 Unspecified dementia without behavioral disturbance: Secondary | ICD-10-CM | POA: Diagnosis not present

## 2023-01-20 DIAGNOSIS — I10 Essential (primary) hypertension: Secondary | ICD-10-CM | POA: Insufficient documentation

## 2023-01-20 DIAGNOSIS — W19XXXA Unspecified fall, initial encounter: Secondary | ICD-10-CM

## 2023-01-20 DIAGNOSIS — M25561 Pain in right knee: Secondary | ICD-10-CM | POA: Diagnosis not present

## 2023-01-20 DIAGNOSIS — W06XXXA Fall from bed, initial encounter: Secondary | ICD-10-CM | POA: Diagnosis not present

## 2023-01-20 DIAGNOSIS — S0990XA Unspecified injury of head, initial encounter: Secondary | ICD-10-CM | POA: Insufficient documentation

## 2023-01-20 DIAGNOSIS — E039 Hypothyroidism, unspecified: Secondary | ICD-10-CM | POA: Diagnosis not present

## 2023-01-20 NOTE — Discharge Instructions (Signed)
You were evaluated in the Emergency Department and after careful evaluation, we did not find any emergent condition requiring admission or further testing in the hospital.  Your exam/testing today is overall reassuring.  No significant injuries.  Recommend follow-up with your primary care doctor, can use Tylenol for discomfort.  Please return to the Emergency Department if you experience any worsening of your condition.   Thank you for allowing [place] to be a part of your care.

## 2023-01-20 NOTE — ED Provider Notes (Signed)
[facility] Emergency Department Provider Note MRN:  [id number]  Arrival date & time: 01/20/23     Chief Complaint   Fall   History of Present Illness   Kerry Garcia is a 89 y.o. year-old female with a history of dementia presenting to the ED with chief complaint of fall.  Larey Seat off the bed at care facility.  Hit her head.  Complaining of right knee pain.  Otherwise no complaints.  Accompanied by her son who provides most of the history.  Review of Systems  I was unable to obtain a full/accurate HPI, PMH, or ROS due to the patient's dementia.  Patient's Health History    Past Medical History:  Diagnosis Date   Carpal tunnel syndrome    Dementia (HCC)    Depression    Fibromyalgia    Hypertension    Hypothyroidism    Incontinence    Spinal stenosis     Past Surgical History:  Procedure Laterality Date   BREAST LUMPECTOMY  1965   SPINE SURGERY      Family History  Problem Relation Age of Onset   Heart failure Father    Prostate cancer Son     Social History   Socioeconomic History   Marital status: Widowed    Spouse name: Not on file   Number of children: Not on file   Years of education: Not on file   Highest education level: Not on file  Occupational History   Not on file  Tobacco Use   Smoking status: Never   Smokeless tobacco: Never  Vaping Use   Vaping status: Never Used  Substance and Sexual Activity   Alcohol use: No   Drug use: No   Sexual activity: Not on file  Other Topics Concern   Not on file  Social History Narrative   ** Merged History Encounter **    Right handed   Lives with family    Social Determinants of Health   Financial Resource Strain: Not on file  Food Insecurity: Not on file  Transportation Needs: Not on file  Physical Activity: Not on file  Stress: Not on file  Social Connections: Not on file  Intimate Partner Violence: Not on file     Physical Exam   Vitals:   01/20/23 0137  BP:  (!) 174/68  Pulse: 84  Resp: 14  Temp: 98.7 F (37.1 C)  SpO2: 100%    CONSTITUTIONAL: Well-appearing, NAD NEURO/PSYCH:  Alert, oriented to name, moves all extremities EYES:  eyes equal and reactive ENT/NECK:  no LAD, no JVD CARDIO: Regular rate, well-perfused, normal S1 and S2 PULM:  CTAB no wheezing or rhonchi GI/GU:  non-distended, non-tender MSK/SPINE:  No gross deformities, no edema SKIN:  no rash, atraumatic   *Additional and/or pertinent findings included in MDM below  Diagnostic and Interventional Summary    EKG Interpretation Date/Time:    Ventricular Rate:    PR Interval:    QRS Duration:    QT Interval:    QTC Calculation:   R Axis:      Text Interpretation:         Labs Reviewed - No data to display  DG Knee Complete 4 Views Right  Final Result    CT Head Wo Contrast  Final Result    CT Cervical Spine Wo Contrast  Final Result      Medications - No data to display   Procedures  /  Critical Care Procedures  ED  Course and Medical Decision Making  Initial Impression and Ddx Suspected mechanical fall out of bed at care facility.  Hit her head but no concerning signs of trauma on exam.  She does have tenderness and reduced range of motion of the right knee due to pain.  CT head will be obtained to exclude intracranial bleeding, x-ray of the knee to evaluate for orthopedic injury.  Otherwise no chest pain or shortness of breath, no abdominal pain or tenderness, reassuring vital signs.  Past medical/surgical history that increases complexity of ED encounter: Dementia  Interpretation of Diagnostics I personally reviewed the knee x-ray and my interpretation is as follows: Arthritis but no fracture  CT head without acute injury, CT cervical spine without acute injury  Patient Reassessment and Ultimate Disposition/Management     Discharge  Patient management required discussion with the following services or consulting groups:  None  Complexity  of Problems Addressed Acute illness or injury that poses threat of life of bodily function  Additional Data Reviewed and Analyzed Further history obtained from: Further history from spouse/family member  Additional Factors Impacting ED Encounter Risk Consideration of hospitalization  Elmer Sow. Pilar Plate, MD [facility] Emergency Medicine [facility] [email or web address]  Final Clinical Impressions(s) / ED Diagnoses     ICD-10-CM   1. Fall, initial encounter  W19.XXXA     2. Acute pain of right knee  M25.561       ED Discharge Orders     None        Discharge Instructions Discussed with and Provided to Patient:     Discharge Instructions      You were evaluated in the Emergency Department and after careful evaluation, we did not find any emergent condition requiring admission or further testing in the hospital.  Your exam/testing today is overall reassuring.  No significant injuries.  Recommend follow-up with your primary care doctor, can use Tylenol for discomfort.  Please return to the Emergency Department if you experience any worsening of your condition.   Thank you for allowing [place] to be a part of your care.       Sabas Sous, MD 01/20/23 [phone]

## 2023-01-20 NOTE — ED Notes (Signed)
[facility] called for D/C transport

## 2023-01-20 NOTE — ED Notes (Signed)
Pt taken to CT.

## 2023-01-20 NOTE — ED Notes (Signed)
[facility] called, no eta 

## 2023-01-20 NOTE — ED Triage Notes (Signed)
Pt had unwitnessed fall this morning. Pt is resident at [facility] and was found on floor with mattress on top of her. Pt complains of right posterior head and neck "numbness". Pt doesn't remember what happened. Pt has history of dementia. Pt is alert and oriented x 2.

## 2023-02-20 ENCOUNTER — Emergency Department (HOSPITAL_COMMUNITY): Payer: Medicare Other

## 2023-02-20 ENCOUNTER — Encounter (HOSPITAL_COMMUNITY): Payer: Self-pay | Admitting: Emergency Medicine

## 2023-02-20 ENCOUNTER — Emergency Department (HOSPITAL_COMMUNITY)
Admission: EM | Admit: 2023-02-20 | Discharge: 2023-02-20 | Disposition: A | Discharge status: Home / Self Care | Payer: Medicare Other | Attending: Emergency Medicine | Admitting: Emergency Medicine

## 2023-02-20 ENCOUNTER — Other Ambulatory Visit: Payer: Self-pay

## 2023-02-20 DIAGNOSIS — Z79899 Other long term (current) drug therapy: Secondary | ICD-10-CM | POA: Diagnosis not present

## 2023-02-20 DIAGNOSIS — E039 Hypothyroidism, unspecified: Secondary | ICD-10-CM | POA: Insufficient documentation

## 2023-02-20 DIAGNOSIS — W06XXXA Fall from bed, initial encounter: Secondary | ICD-10-CM | POA: Diagnosis not present

## 2023-02-20 DIAGNOSIS — S0990XA Unspecified injury of head, initial encounter: Secondary | ICD-10-CM | POA: Diagnosis present

## 2023-02-20 DIAGNOSIS — F0390 Unspecified dementia without behavioral disturbance: Secondary | ICD-10-CM | POA: Insufficient documentation

## 2023-02-20 DIAGNOSIS — W19XXXA Unspecified fall, initial encounter: Secondary | ICD-10-CM

## 2023-02-20 DIAGNOSIS — I10 Essential (primary) hypertension: Secondary | ICD-10-CM | POA: Diagnosis not present

## 2023-02-20 DIAGNOSIS — S0003XA Contusion of scalp, initial encounter: Secondary | ICD-10-CM | POA: Insufficient documentation

## 2023-02-20 NOTE — ED Provider Notes (Signed)
[place] EMERGENCY DEPARTMENT AT [facility] Provider Note   CSN: [id number] Arrival date & time: 02/20/23  1950     History  Chief Complaint  Patient presents with   Fall    Kerry Garcia is a 89 y.o. female.   Fall  Patient reportedly fell out of bed.  Hit back of head.  Only complaint is pain on the back of her head.  Not on blood thinners.  Does have a history of dementia.  No neck pain.  No numbness or weakness.    Past Medical History:  Diagnosis Date   Carpal tunnel syndrome    Dementia (HCC)    Depression    Fibromyalgia    Hypertension    Hypothyroidism    Incontinence    Spinal stenosis     Home Medications Prior to Admission medications   Medication Sig Start Date End Date Taking? Authorizing Provider  amLODipine (NORVASC) 5 MG tablet Take 1 tablet by mouth every morning. 12/14/21   Meriam Sprague, MD  brimonidine (ALPHAGAN) 0.2 % ophthalmic solution 1 drop 2 (two) times daily. 05/26/21   [provider]  celecoxib (CELEBREX) 200 MG capsule Take 200 mg by mouth daily at 2 PM. 08/14/18   [provider]  celecoxib (CELEBREX) 200 MG capsule 1 capsule with food    [provider]  COMBIGAN 0.2-0.5 % ophthalmic solution Place 1 drop into the right eye every 12 (twelve) hours.  06/22/18   [provider]  divalproex (DEPAKOTE) 500 MG DR tablet Take 500 mg by mouth 2 (two) times daily.    [provider]  escitalopram (LEXAPRO) 20 MG tablet Take 20 mg by mouth at bedtime. 05/31/16   [provider]  furosemide (LASIX) 20 MG tablet Take 1 tablet (20 mg total) by mouth daily. You may take an extra dose of lasix 20 mg by mouth three times weekly, as needed for swelling. Please make overdue appt with Dr. Shari Prows before anymore refills. Thank you Final attempt Patient not taking: Reported on 06/25/2022 12/16/21   Meriam Sprague, MD  hydrocortisone cream 0.5 % 1 application 03/31/21   [provider]  nitrofurantoin (MACRODANTIN) 100 MG capsule Take 100 mg by mouth 2 (two) times daily. 03/31/21   [provider]  triamterene-hydrochlorothiazide (MAXZIDE-25) 37.5-25 MG tablet Take 1 tablet by mouth every morning. 09/01/20   [provider]  UNABLE TO FIND Take 0.4 capsules by mouth daily. Reguloid-0.4 gm    [provider]      Allergies    Ibuprofen, Ciprofloxacin, Lisinopril, Other, and Sulfa antibiotics    Review of Systems   Review of Systems  Physical Exam Updated Vital Signs BP (!) 134/50   Pulse 71   Temp 98.2 F (36.8 C) (Oral)   Resp 16   Ht 5\' 4"  (1.626 m)   Wt 81.6 kg   LMP 03/16/1963   SpO2 98%   BMI 30.90 kg/m  Physical Exam Vitals and nursing note reviewed.  HENT:     Head:     Comments: Occipital hematoma. Eyes:     Pupils: Pupils are equal, round, and reactive to light.  Cardiovascular:     Rate and Rhythm: Regular rhythm.  Chest:     Chest wall: No tenderness.  Abdominal:     Tenderness: There is no abdominal tenderness.  Musculoskeletal:        General: No tenderness.     Cervical back: Neck supple. No  tenderness.  Skin:    General: Skin is warm.  Neurological:     Mental Status: Mental status is at baseline.     ED Results / Procedures / Treatments   Labs (all labs ordered are listed, but only abnormal results are displayed) Labs Reviewed - No data to display  EKG None  Radiology CT Head Wo Contrast  Result Date: 02/20/2023 CLINICAL DATA:  Fall, headache, posterior head hematoma EXAM: CT HEAD WITHOUT CONTRAST CT CERVICAL SPINE WITHOUT CONTRAST TECHNIQUE: Multidetector CT imaging of the head and cervical spine was performed following the standard protocol without intravenous contrast. Multiplanar CT image reconstructions of the cervical spine were also generated. RADIATION DOSE REDUCTION: This exam was performed according to the departmental dose-optimization program which includes automated  exposure control, adjustment of the mA and/or kV according to patient size and/or use of iterative reconstruction technique. COMPARISON:  01/20/2023 FINDINGS: CT HEAD FINDINGS Brain: No evidence of acute infarction, hemorrhage, hydrocephalus, extra-axial collection or mass lesion/mass effect. Cortical and central atrophy.  Secondary ventricular prominence. Subcortical white matter and periventricular small vessel ischemic changes. Vascular: Intracranial atherosclerosis. Skull: Normal. Negative for fracture or focal lesion. Sinuses/Orbits: The visualized paranasal sinuses are essentially clear. The mastoid air cells are unopacified. Other: None. CT CERVICAL SPINE FINDINGS Alignment: Normal cervical lordosis. Skull base and vertebrae: No acute fracture. No primary bone lesion or focal pathologic process. Soft tissues and spinal canal: No prevertebral fluid or swelling. No visible canal hematoma. Disc levels: Status post suboccipital craniotomy. Mild degenerative changes of the mid/lower cervical spine. Spinal canal is patent. Upper chest: Visualized lung apices are clear. Other: Right thyroid goiter, incompletely visualized. This has been previously evaluated on thyroid ultrasound. IMPRESSION: No acute intracranial abnormality. Atrophy with small vessel ischemic changes. No traumatic injury to the cervical spine. Mild degenerative changes. Electronically Signed   By: Charline Bills M.D.   On: 02/20/2023 20:49   CT Cervical Spine Wo Contrast  Result Date: 02/20/2023 CLINICAL DATA:  Fall, headache, posterior head hematoma EXAM: CT HEAD WITHOUT CONTRAST CT CERVICAL SPINE WITHOUT CONTRAST TECHNIQUE: Multidetector CT imaging of the head and cervical spine was performed following the standard protocol without intravenous contrast. Multiplanar CT image reconstructions of the cervical spine were also generated. RADIATION DOSE REDUCTION: This exam was performed according to the departmental dose-optimization program  which includes automated exposure control, adjustment of the mA and/or kV according to patient size and/or use of iterative reconstruction technique. COMPARISON:  01/20/2023 FINDINGS: CT HEAD FINDINGS Brain: No evidence of acute infarction, hemorrhage, hydrocephalus, extra-axial collection or mass lesion/mass effect. Cortical and central atrophy.  Secondary ventricular prominence. Subcortical white matter and periventricular small vessel ischemic changes. Vascular: Intracranial atherosclerosis. Skull: Normal. Negative for fracture or focal lesion. Sinuses/Orbits: The visualized paranasal sinuses are essentially clear. The mastoid air cells are unopacified. Other: None. CT CERVICAL SPINE FINDINGS Alignment: Normal cervical lordosis. Skull base and vertebrae: No acute fracture. No primary bone lesion or focal pathologic process. Soft tissues and spinal canal: No prevertebral fluid or swelling. No visible canal hematoma. Disc levels: Status post suboccipital craniotomy. Mild degenerative changes of the mid/lower cervical spine. Spinal canal is patent. Upper chest: Visualized lung apices are clear. Other: Right thyroid goiter, incompletely visualized. This has been previously evaluated on thyroid ultrasound. IMPRESSION: No acute intracranial abnormality. Atrophy with small vessel ischemic changes. No traumatic injury to the cervical spine. Mild degenerative changes. Electronically Signed   By: Charline Bills M.D.   On: 02/20/2023 20:49    Procedures  Procedures    Medications Ordered in ED Medications - No data to display  ED Course/ Medical Decision Making/ A&P                                 Medical Decision Making Amount and/or Complexity of Data Reviewed Radiology: ordered.   Patient with fall.  Reportedly fell out of bed hit back of head.  Has hematoma.  No other tenderness.  Head CT and cervical spine CT reassuring.  No bleed seen.  Will not look for other injuries since no tenderness.  Will  discharge home.        Final Clinical Impression(s) / ED Diagnoses Final diagnoses:  Fall, initial encounter  Scalp hematoma, initial encounter    Rx / DC Orders ED Discharge Orders     None         Benjiman Core, MD 02/20/23 2321

## 2023-02-20 NOTE — ED Triage Notes (Signed)
Pt arrives via EMS from [facility] assisted [facility] with fall out of bed. Pt c/o headache. Noted to have a hematoma to the back of her head. Pt is awake, alert, oriented to person, place, situation. No active distress noted. Vitals 152/62, HR 78 NSR, 98% Cbg 144.

## 2023-02-20 NOTE — ED Notes (Signed)
Patient transported to CT 

## 2023-02-20 NOTE — ED Notes (Signed)
[facility] contacted for pt transportation to [facility]

## 2023-03-22 ENCOUNTER — Ambulatory Visit: Payer: Medicare Other | Admitting: Podiatry

## 2023-04-05 ENCOUNTER — Encounter: Payer: Self-pay | Admitting: Podiatry

## 2023-04-05 ENCOUNTER — Ambulatory Visit (INDEPENDENT_AMBULATORY_CARE_PROVIDER_SITE_OTHER): Payer: Medicare Other | Admitting: Podiatry

## 2023-04-05 DIAGNOSIS — B351 Tinea unguium: Secondary | ICD-10-CM

## 2023-04-05 DIAGNOSIS — I739 Peripheral vascular disease, unspecified: Secondary | ICD-10-CM | POA: Diagnosis not present

## 2023-04-05 DIAGNOSIS — M79675 Pain in left toe(s): Secondary | ICD-10-CM | POA: Diagnosis not present

## 2023-04-05 DIAGNOSIS — M79674 Pain in right toe(s): Secondary | ICD-10-CM | POA: Diagnosis not present

## 2023-04-05 NOTE — Progress Notes (Signed)
This patient presents to the office with chief complaint of long thick painful nails.  Patient says the nails are painful walking and wearing shoes.  This patient is unable to self treat.  This patient is unable to trim her nails since she is unable to reach her nails.  She presents to the office with her son and is in a wheelchair.  She presents to the office for preventative foot care services.  General Appearance  Alert, conversant and in no acute stress.  Vascular  Dorsalis pedis and posterior tibial  pulses are weakly palpable  bilaterally.  Capillary return is within normal limits  bilaterally. Temperature is within normal limits  bilaterally.  Neurologic  Senn-Weinstein monofilament wire test within normal limits  bilaterally. Muscle power within normal limits bilaterally.  Nails Thick disfigured discolored nails with subungual debris  from hallux to fifth toes bilaterally. No evidence of bacterial infection or drainage bilaterally.  Orthopedic  No limitations of motion  feet .  No crepitus or effusions noted.  No bony pathology or digital deformities noted.  HAV  B/L.  Skin  normotropic skin with no porokeratosis noted bilaterally.  No signs of infections or ulcers noted.     Onychomycosis  Nails  B/L.  Pain in right toes  Pain in left toes  Debridement of nails both feet with a nail nipper  Followed with trimming  the nails with dremel tool.  The son is interested in shoes for her swollen feet.  RTC 3 months.   Helane Gunther DPM

## 2023-04-14 ENCOUNTER — Emergency Department (HOSPITAL_COMMUNITY)
Admission: EM | Admit: 2023-04-14 | Discharge: 2023-04-14 | Disposition: A | Discharge status: Home / Self Care | Payer: Medicare Other | Attending: Emergency Medicine | Admitting: Emergency Medicine

## 2023-04-14 ENCOUNTER — Emergency Department (HOSPITAL_COMMUNITY): Payer: Medicare Other

## 2023-04-14 DIAGNOSIS — M25569 Pain in unspecified knee: Secondary | ICD-10-CM | POA: Insufficient documentation

## 2023-04-14 DIAGNOSIS — W19XXXA Unspecified fall, initial encounter: Secondary | ICD-10-CM | POA: Insufficient documentation

## 2023-04-14 DIAGNOSIS — I10 Essential (primary) hypertension: Secondary | ICD-10-CM | POA: Insufficient documentation

## 2023-04-14 DIAGNOSIS — F0390 Unspecified dementia without behavioral disturbance: Secondary | ICD-10-CM | POA: Insufficient documentation

## 2023-04-14 DIAGNOSIS — R911 Solitary pulmonary nodule: Secondary | ICD-10-CM | POA: Diagnosis not present

## 2023-04-14 DIAGNOSIS — Z79899 Other long term (current) drug therapy: Secondary | ICD-10-CM | POA: Insufficient documentation

## 2023-04-14 DIAGNOSIS — M25461 Effusion, right knee: Secondary | ICD-10-CM | POA: Insufficient documentation

## 2023-04-14 DIAGNOSIS — M25561 Pain in right knee: Secondary | ICD-10-CM | POA: Diagnosis present

## 2023-04-14 NOTE — Discharge Instructions (Addendum)
You have fluid in both knees from arthritis.  If you have not seen an orthopedic doctor, I recommend that you follow-up with Dr. Wilnette Kales also have a lung nodule that can be follow-up with your primary care doctor.  Return to ER if you have another fall, severe pain, vomiting

## 2023-04-14 NOTE — ED Notes (Signed)
[facility] called. "Second in line"

## 2023-04-14 NOTE — ED Triage Notes (Signed)
Patient from [facility] for eval of R knee pain after unwitnessed fall from bed. Staff state they think she stood beside the bed and then slid down. EMS reports early stages of dementia and that she is acting at her baseline. Patient denies knee pain on arrival, just states she has pain to heels.

## 2023-04-14 NOTE — ED Notes (Signed)
Assumed pt care.

## 2023-04-14 NOTE — ED Provider Notes (Signed)
[place] [facility]ENC[facility] DEPARTMENT AT [facility] Provider Note   CSN: [id number] Arrival date & time: 04/14/23  1428     History  Chief Complaint  Patient presents with   Fall    Kerry Garcia is a 89 y.o. female history of dementia, hypertension here presenting with fall.  Patient is in the memory care unit.  She is not clear how she fell.  She complains of bilateral knee pain.  Patient has history of knee arthritis with effusion.  The history is provided by the patient.       Home Medications Prior to Admission medications   Medication Sig Start Date End Date Taking? Authorizing Provider  amLODipine (NORVASC) 5 MG tablet Take 1 tablet by mouth every morning. 12/14/21   Meriam Sprague, MD  brimonidine (ALPHAGAN) 0.2 % ophthalmic solution 1 drop 2 (two) times daily. 05/26/21   [provider]  celecoxib (CELEBREX) 200 MG capsule Take 200 mg by mouth daily at 2 PM. 08/14/18   [provider]  celecoxib (CELEBREX) 200 MG capsule 1 capsule with food    [provider]  COMBIGAN 0.2-0.5 % ophthalmic solution Place 1 drop into the right eye every 12 (twelve) hours.  06/22/18   [provider]  divalproex (DEPAKOTE) 500 MG DR tablet Take 500 mg by mouth 2 (two) times daily.    [provider]  escitalopram (LEXAPRO) 20 MG tablet Take 20 mg by mouth at bedtime. 05/31/16   [provider]  furosemide (LASIX) 20 MG tablet Take 1 tablet (20 mg total) by mouth daily. You may take an extra dose of lasix 20 mg by mouth three times weekly, as needed for swelling. Please make overdue appt with Dr. Shari Prows before anymore refills. Thank you Final attempt Patient not taking: Reported on 06/25/2022 12/16/21   Meriam Sprague, MD  hydrocortisone cream 0.5 % 1 application 03/31/21   [provider]  nitrofurantoin (MACRODANTIN) 100 MG capsule Take 100 mg by mouth 2 (two) times daily. 03/31/21   [provider]   triamterene-hydrochlorothiazide (MAXZIDE-25) 37.5-25 MG tablet Take 1 tablet by mouth every morning. 09/01/20   [provider]  UNABLE TO FIND Take 0.4 capsules by mouth daily. Reguloid-0.4 gm    [provider]      Allergies    Ibuprofen, Ciprofloxacin, Lisinopril, Other, and Sulfa antibiotics    Review of Systems   Review of Systems  Musculoskeletal:        Bilateral knee pain  All other systems reviewed and are negative.   Physical Exam Updated Vital Signs BP 136/64 (BP Location: Left Arm)   Pulse 69   Temp 98.6 F (37 C) (Oral)   Resp 14   LMP 03/16/1963   SpO2 99%  Physical Exam Vitals and nursing note reviewed.  Constitutional:      Appearance: Normal appearance.  HENT:     Head: Normocephalic.     Comments: Questionable posterior scalp hematoma    Nose: Nose normal.     Mouth/Throat:     Mouth: Mucous membranes are moist.  Eyes:     Extraocular Movements: Extraocular movements intact.     Pupils: Pupils are equal, round, and reactive to light.  Cardiovascular:     Rate and Rhythm: Normal rate and regular rhythm.     Pulses: Normal pulses.     Heart sounds: Normal heart sounds.  Pulmonary:     Effort: Pulmonary effort is normal.  Breath sounds: Normal breath sounds.  Abdominal:     General: Abdomen is flat.     Palpations: Abdomen is soft.  Musculoskeletal:     Cervical back: Normal range of motion and neck supple.     Comments: Bilateral knee effusion but no obvious deformity. Normal ROM of bilateral hips. No obvious spinal tenderness or step off   Skin:    General: Skin is warm.  Neurological:     General: No focal deficit present.     Mental Status: She is alert.     Comments: Demented, moving all extremities   Psychiatric:        Mood and Affect: Mood normal.        Behavior: Behavior normal.     ED Results / Procedures / Treatments   Labs (all labs ordered are listed, but only abnormal results are displayed) Labs  Reviewed - No data to display  EKG None  Radiology CT HEAD WO CONTRAST ( ) Result Date: 04/14/2023 CLINICAL DATA:  Head trauma, intracranial venous injury suspected; Neck trauma (Age >= 65y). Unwitnessed fall from bed. EXAM: CT HEAD WITHOUT CONTRAST CT CERVICAL SPINE WITHOUT CONTRAST TECHNIQUE: Multidetector CT imaging of the head and cervical spine was performed following the standard protocol without intravenous contrast. Multiplanar CT image reconstructions of the cervical spine were also generated. RADIATION DOSE REDUCTION: This exam was performed according to the departmental dose-optimization program which includes automated exposure control, adjustment of the mA and/or kV according to patient size and/or use of iterative reconstruction technique. COMPARISON:  Nuclear medicine thyroid 05/26/2010, ultrasound soft tissue neck 05/25/2010 FINDINGS: CT HEAD FINDINGS Brain: Patchy and confluent areas of decreased attenuation are noted throughout the deep and periventricular white matter of the cerebral hemispheres bilaterally, compatible with chronic microvascular ischemic disease. No evidence of large-territorial acute infarction. No parenchymal hemorrhage. No mass lesion. No extra-axial collection. No mass effect or midline shift. No hydrocephalus. Basilar cisterns are patent. Vascular: No hyperdense vessel. Atherosclerotic calcifications are present within the cavernous internal carotid and vertebral arteries. Skull: No acute fracture or focal lesion. Sinuses/Orbits: Paranasal sinuses and mastoid air cells are clear. Bilateral lens replacement. Otherwise the orbits are unremarkable. Other: None. CT CERVICAL SPINE FINDINGS Alignment: Normal. Skull base and vertebrae: Multilevel moderate degenerative changes spine with bulky anterior osteophyte formation. No acute fracture. No aggressive appearing focal osseous lesion or focal pathologic process. Soft tissues and spinal canal: No prevertebral fluid or  swelling. No visible canal hematoma. Upper chest: Unremarkable. Other: Markedly enlarged right thyroid gland measuring up to 6 cm. IMPRESSION: 1. No acute intracranial abnormality. 2. No acute displaced fracture or traumatic listhesis of the cervical spine. 3. Markedly enlarged right thyroid gland measuring up to 6 cm. This has been evaluated on previous imaging. (ref: J Am Coll Radiol. 2015 Feb;12(2): 143-50). Electronically Signed   By: Tish Frederickson M.D.   On: 04/14/2023 17:45   CT Cervical Spine Wo Contrast Result Date: 04/14/2023 CLINICAL DATA:  Head trauma, intracranial venous injury suspected; Neck trauma (Age >= 65y). Unwitnessed fall from bed. EXAM: CT HEAD WITHOUT CONTRAST CT CERVICAL SPINE WITHOUT CONTRAST TECHNIQUE: Multidetector CT imaging of the head and cervical spine was performed following the standard protocol without intravenous contrast. Multiplanar CT image reconstructions of the cervical spine were also generated. RADIATION DOSE REDUCTION: This exam was performed according to the departmental dose-optimization program which includes automated exposure control, adjustment of the mA and/or kV according to patient size and/or use of iterative reconstruction technique. COMPARISON:  Nuclear  medicine thyroid 05/26/2010, ultrasound soft tissue neck 05/25/2010 FINDINGS: CT HEAD FINDINGS Brain: Patchy and confluent areas of decreased attenuation are noted throughout the deep and periventricular white matter of the cerebral hemispheres bilaterally, compatible with chronic microvascular ischemic disease. No evidence of large-territorial acute infarction. No parenchymal hemorrhage. No mass lesion. No extra-axial collection. No mass effect or midline shift. No hydrocephalus. Basilar cisterns are patent. Vascular: No hyperdense vessel. Atherosclerotic calcifications are present within the cavernous internal carotid and vertebral arteries. Skull: No acute fracture or focal lesion. Sinuses/Orbits:  Paranasal sinuses and mastoid air cells are clear. Bilateral lens replacement. Otherwise the orbits are unremarkable. Other: None. CT CERVICAL SPINE FINDINGS Alignment: Normal. Skull base and vertebrae: Multilevel moderate degenerative changes spine with bulky anterior osteophyte formation. No acute fracture. No aggressive appearing focal osseous lesion or focal pathologic process. Soft tissues and spinal canal: No prevertebral fluid or swelling. No visible canal hematoma. Upper chest: Unremarkable. Other: Markedly enlarged right thyroid gland measuring up to 6 cm. IMPRESSION: 1. No acute intracranial abnormality. 2. No acute displaced fracture or traumatic listhesis of the cervical spine. 3. Markedly enlarged right thyroid gland measuring up to 6 cm. This has been evaluated on previous imaging. (ref: J Am Coll Radiol. 2015 Feb;12(2): 143-50). Electronically Signed   By: Tish Frederickson M.D.   On: 04/14/2023 17:45   DG Chest 1 View Result Date: 04/14/2023 CLINICAL DATA:  Larey Seat. EXAM: CHEST  1 VIEW COMPARISON:  12/10/2021.  Chest CTA dated 06/14/2020 FINDINGS: Interval patient rotation to the right this is accentuating the size of the previously demonstrated large right substernal goiter. Mildly enlarged cardiac silhouette. Tortuous and partially calcified thoracic aorta. Poor inspiration with mild prominence of the pulmonary vasculature. Interval 2 faintly visualized possible nodules in the left upper lung zone. Otherwise, clear lungs. No pleural fluid. No fracture or pneumothorax seen mild bilateral glenohumeral degenerative changes mild thoracic scoliosis and degenerative changes. IMPRESSION: 1. Interval 2 faintly visualized possible nodules in the left upper lung zone. Clinically indicated, these could be further evaluated with a chest CT without contrast. 2. No fracture or pneumothorax seen. 3. Mild cardiomegaly and possible mild pulmonary vascular congestion. 4. Previously demonstrated large right substernal  goiter. Electronically Signed   By: Beckie Salts M.D.   On: 04/14/2023 16:55   DG Pelvis 1-2 Views Result Date: 04/14/2023 CLINICAL DATA:  Larey Seat.  No reported pelvic pain. EXAM: PELVIS - 1-2 VIEW COMPARISON:  None Available. FINDINGS: Diffuse osteopenia. No fracture or dislocation seen. Extensive lower lumbar spine degenerative changes IMPRESSION: 1. No fracture. 2. Diffuse osteopenia. 3. Extensive lower lumbar spine degenerative changes. Electronically Signed   By: Beckie Salts M.D.   On: 04/14/2023 16:49   DG Knee Complete 4 Views Left Result Date: 04/14/2023 CLINICAL DATA:  Left knee pain following a fall. EXAM: LEFT KNEE - COMPLETE 4+ VIEW COMPARISON:  None Available. FINDINGS: Moderate to marked medial joint space narrowing and irregularity with moderate to marked medial and patellofemoral degenerative spur formation. Mild lateral spur formation. No fracture, dislocation or effusion seen. IMPRESSION: 1. No fracture. 2. Moderate to marked medial and patellofemoral degenerative changes. Electronically Signed   By: Beckie Salts M.D.   On: 04/14/2023 16:48   DG Knee Complete 4 Views Right Result Date: 04/14/2023 CLINICAL DATA:  Right knee pain following a fall. EXAM: RIGHT KNEE - COMPLETE 4+ VIEW COMPARISON:  01/20/2023 FINDINGS: Again demonstrated is marked medial and lateral joint space narrowing with associated spur formation. Marked patellofemoral degenerative changes are again  demonstrated. Moderate-sized effusion without significant change no fracture or dislocation seen IMPRESSION: 1. No fracture. 2. Moderate-sized effusion without significant change. 3. Marked tricompartmental degenerative changes. Electronically Signed   By: Beckie Salts M.D.   On: 04/14/2023 16:47    Procedures Procedures    Medications Ordered in ED Medications - No data to display  ED Course/ Medical Decision Making/ A&P                                 Medical Decision Making Amazin SHANIYA TASHIRO is a 89 y.o.  female here presenting with fall.  Unclear how she fell.  Patient has multiple falls previously. Will get CT head/neck, cxr, pelvis xray and bilateral knee xrays   6:07 PM I reviewed patient's x-rays and patient has bilateral knee effusion from arthritis.  I do not see any signs of septic knees.  Patient does have incidental nodules in the left upper lobe.  Patient can get that evaluated outpatient.  At this point patient is stable for discharge    Problems Addressed: Effusion of both knee joints: chronic illness or injury Fall, initial encounter: acute illness or injury Lung nodule: undiagnosed new problem with uncertain prognosis  Amount and/or Complexity of Data Reviewed Radiology: ordered. Decision-making details documented in ED Course.    Final Clinical Impression(s) / ED Diagnoses Final diagnoses:  None    Rx / DC Orders ED Discharge Orders     None         Charlynne Pander, MD 04/14/23 [id number]

## 2023-04-28 ENCOUNTER — Ambulatory Visit: Payer: Medicare Other

## 2023-04-28 NOTE — Progress Notes (Signed)
Cancelled patient was very upset at appt for shoes, stating she is not diabetic  And then does not qualify for DM shoes and inserts  Addison Bailey CPed, CFo, CFm

## 2023-07-05 ENCOUNTER — Ambulatory Visit (INDEPENDENT_AMBULATORY_CARE_PROVIDER_SITE_OTHER): Payer: Medicare Other | Admitting: Podiatry

## 2023-07-05 ENCOUNTER — Encounter: Payer: Self-pay | Admitting: Podiatry

## 2023-07-05 DIAGNOSIS — I739 Peripheral vascular disease, unspecified: Secondary | ICD-10-CM

## 2023-07-05 DIAGNOSIS — M79674 Pain in right toe(s): Secondary | ICD-10-CM | POA: Diagnosis not present

## 2023-07-05 DIAGNOSIS — M79675 Pain in left toe(s): Secondary | ICD-10-CM | POA: Diagnosis not present

## 2023-07-05 DIAGNOSIS — B351 Tinea unguium: Secondary | ICD-10-CM | POA: Diagnosis not present

## 2023-07-05 NOTE — Progress Notes (Signed)
 This patient presents to the office with chief complaint of long thick painful nails.  Patient says the nails are painful walking and wearing shoes.  This patient is unable to self treat.  This patient is unable to trim her nails since she is unable to reach her nails.  She presents to the office with her son and is in a wheelchair.  She presents to the office for preventative foot care services.  General Appearance  Alert, conversant and in no acute stress.  Vascular  Dorsalis pedis and posterior tibial  pulses are weakly palpable  bilaterally.  Capillary return is within normal limits  bilaterally. Temperature is within normal limits  bilaterally.  Neurologic  Senn-Weinstein monofilament wire test within normal limits  bilaterally. Muscle power within normal limits bilaterally.  Nails Thick disfigured discolored nails with subungual debris  from hallux to fifth toes bilaterally. No evidence of bacterial infection or drainage bilaterally.  Orthopedic  No limitations of motion  feet .  No crepitus or effusions noted.  No bony pathology or digital deformities noted.  HAV  B/L.  Skin  normotropic skin with no porokeratosis noted bilaterally.  No signs of infections or ulcers noted.     Onychomycosis  Nails  B/L.  Pain in right toes  Pain in left toes  Debridement of nails both feet with a nail nipper  Followed with trimming  the nails with dremel tool.  The son is interested in shoes for her swollen feet.  RTC 3 months.   Helane Gunther DPM

## 2023-10-04 ENCOUNTER — Ambulatory Visit (INDEPENDENT_AMBULATORY_CARE_PROVIDER_SITE_OTHER): Admitting: Podiatry

## 2023-10-04 ENCOUNTER — Encounter: Payer: Self-pay | Admitting: Podiatry

## 2023-10-04 DIAGNOSIS — M79674 Pain in right toe(s): Secondary | ICD-10-CM

## 2023-10-04 DIAGNOSIS — M79675 Pain in left toe(s): Secondary | ICD-10-CM | POA: Diagnosis not present

## 2023-10-04 DIAGNOSIS — I739 Peripheral vascular disease, unspecified: Secondary | ICD-10-CM | POA: Diagnosis not present

## 2023-10-04 DIAGNOSIS — B351 Tinea unguium: Secondary | ICD-10-CM | POA: Diagnosis not present

## 2023-10-04 NOTE — Progress Notes (Signed)
 This patient presents to the office with chief complaint of long thick painful nails.  Patient says the nails are painful walking and wearing shoes.  This patient is unable to self treat.  This patient is unable to trim her nails since she is unable to reach her nails.  She presents to the office with her son and is in a wheelchair.  She presents to the office for preventative foot care services.  General Appearance  Alert, conversant and in no acute stress.  Vascular  Dorsalis pedis and posterior tibial  pulses are weakly palpable  bilaterally.  Capillary return is within normal limits  bilaterally. Temperature is within normal limits  bilaterally.  Neurologic  Senn-Weinstein monofilament wire test within normal limits  bilaterally. Muscle power within normal limits bilaterally.  Nails Thick disfigured discolored nails with subungual debris  from hallux to fifth toes bilaterally. No evidence of bacterial infection or drainage bilaterally.  Orthopedic  No limitations of motion  feet .  No crepitus or effusions noted.  No bony pathology or digital deformities noted.  HAV  B/L.  Skin  normotropic skin with no porokeratosis noted bilaterally.  No signs of infections or ulcers noted.     Onychomycosis  Nails  B/L.  Pain in right toes  Pain in left toes  Debridement of nails both feet with a nail nipper  Followed with trimming  the nails with dremel tool.  The son is interested in shoes for her swollen feet.  RTC 3 months.   Helane Gunther DPM

## 2024-01-04 ENCOUNTER — Ambulatory Visit: Admitting: Podiatry

## 2024-01-04 ENCOUNTER — Encounter: Payer: Self-pay | Admitting: Podiatry

## 2024-01-04 DIAGNOSIS — B351 Tinea unguium: Secondary | ICD-10-CM

## 2024-01-04 DIAGNOSIS — I739 Peripheral vascular disease, unspecified: Secondary | ICD-10-CM | POA: Diagnosis not present

## 2024-01-04 DIAGNOSIS — M79674 Pain in right toe(s): Secondary | ICD-10-CM | POA: Diagnosis not present

## 2024-01-04 DIAGNOSIS — M79675 Pain in left toe(s): Secondary | ICD-10-CM | POA: Diagnosis not present

## 2024-01-04 NOTE — Progress Notes (Signed)
 This patient presents to the office with chief complaint of long thick painful nails.  Patient says the nails are painful walking and wearing shoes.  This patient is unable to self treat.  This patient is unable to trim her nails since she is unable to reach her nails.  She presents to the office with her son and is in a wheelchair.  She presents to the office for preventative foot care services.  General Appearance  Alert, conversant and in no acute stress.  Vascular  Dorsalis pedis and posterior tibial  pulses are weakly palpable  bilaterally.  Capillary return is within normal limits  bilaterally. Temperature is within normal limits  bilaterally.  Neurologic  Senn-Weinstein monofilament wire test within normal limits  bilaterally. Muscle power within normal limits bilaterally.  Nails Thick disfigured discolored nails with subungual debris  from hallux to fifth toes bilaterally. No evidence of bacterial infection or drainage bilaterally.  Orthopedic  No limitations of motion  feet .  No crepitus or effusions noted.  No bony pathology or digital deformities noted.  HAV  B/L.  Skin  normotropic skin with no porokeratosis noted bilaterally.  No signs of infections or ulcers noted.     Onychomycosis  Nails  B/L.  Pain in right toes  Pain in left toes  Debridement of nails both feet with a nail nipper  Followed with trimming  the nails with dremel tool.  The son is interested in shoes for her swollen feet.  RTC 3 months.   Helane Gunther DPM

## 2024-01-24 ENCOUNTER — Ambulatory Visit: Admitting: Podiatry

## 2024-04-05 ENCOUNTER — Ambulatory Visit: Admitting: Podiatry
